# Patient Record
Sex: Female | Born: 1955 | Hispanic: No | Marital: Married | Smoking: Never smoker
Health system: Southern US, Community
[De-identification: ages and names within clinical notes are randomized; demographics above are authoritative.]

## PROBLEM LIST (undated history)

## (undated) DIAGNOSIS — M1990 Unspecified osteoarthritis, unspecified site: Secondary | ICD-10-CM

## (undated) DIAGNOSIS — I10 Essential (primary) hypertension: Secondary | ICD-10-CM

## (undated) DIAGNOSIS — C801 Malignant (primary) neoplasm, unspecified: Secondary | ICD-10-CM

## (undated) DIAGNOSIS — Z8679 Personal history of other diseases of the circulatory system: Secondary | ICD-10-CM

## (undated) DIAGNOSIS — J45909 Unspecified asthma, uncomplicated: Secondary | ICD-10-CM

## (undated) HISTORY — DX: Unspecified asthma, uncomplicated: J45.909

## (undated) HISTORY — DX: Unspecified osteoarthritis, unspecified site: M19.90

## (undated) HISTORY — DX: Essential (primary) hypertension: I10

## (undated) HISTORY — PX: OTHER SURGICAL HISTORY: SHX169

## (undated) HISTORY — DX: Malignant (primary) neoplasm, unspecified: C80.1

## (undated) HISTORY — PX: CHOLECYSTECTOMY: SHX55

---

## 2004-02-29 ENCOUNTER — Other Ambulatory Visit: Admission: RE | Admit: 2004-02-29 | Discharge: 2004-02-29 | Payer: Self-pay | Admitting: Obstetrics and Gynecology

## 2004-03-28 ENCOUNTER — Ambulatory Visit (HOSPITAL_COMMUNITY): Admission: RE | Admit: 2004-03-28 | Discharge: 2004-03-28 | Payer: Self-pay | Admitting: Obstetrics and Gynecology

## 2004-04-05 ENCOUNTER — Ambulatory Visit (HOSPITAL_COMMUNITY): Admission: RE | Admit: 2004-04-05 | Discharge: 2004-04-05 | Payer: Self-pay | Admitting: Obstetrics and Gynecology

## 2007-03-01 ENCOUNTER — Encounter: Admission: RE | Admit: 2007-03-01 | Discharge: 2007-03-01 | Payer: Self-pay | Admitting: Obstetrics and Gynecology

## 2007-03-03 ENCOUNTER — Emergency Department (HOSPITAL_COMMUNITY): Admission: EM | Admit: 2007-03-03 | Discharge: 2007-03-04 | Payer: Self-pay | Admitting: Emergency Medicine

## 2007-03-08 ENCOUNTER — Encounter (INDEPENDENT_AMBULATORY_CARE_PROVIDER_SITE_OTHER): Payer: Self-pay | Admitting: General Surgery

## 2007-03-08 ENCOUNTER — Ambulatory Visit (HOSPITAL_COMMUNITY): Admission: RE | Admit: 2007-03-08 | Discharge: 2007-03-08 | Payer: Self-pay | Admitting: General Surgery

## 2008-04-03 ENCOUNTER — Ambulatory Visit (HOSPITAL_COMMUNITY): Admission: RE | Admit: 2008-04-03 | Discharge: 2008-04-03 | Payer: Self-pay | Admitting: Obstetrics and Gynecology

## 2008-04-03 ENCOUNTER — Encounter (HOSPITAL_COMMUNITY): Payer: Self-pay | Admitting: Obstetrics and Gynecology

## 2008-09-08 IMAGING — MG MM DIAGNOSTIC BILATERAL
4 series · 4 of 4 positions shown · non-contrast
Comparison: 03/28/04.

DG DIAGNOSTIC BILATERAL
Bilateral CC and MLO view(s) were taken.

DIGITAL BILATERAL DIAGNOSTIC MAMMOGRAM WITH CAD:
CLINICAL DATA: 51-year-old female with diffuse left breast pain, cyclical.

[R CC]
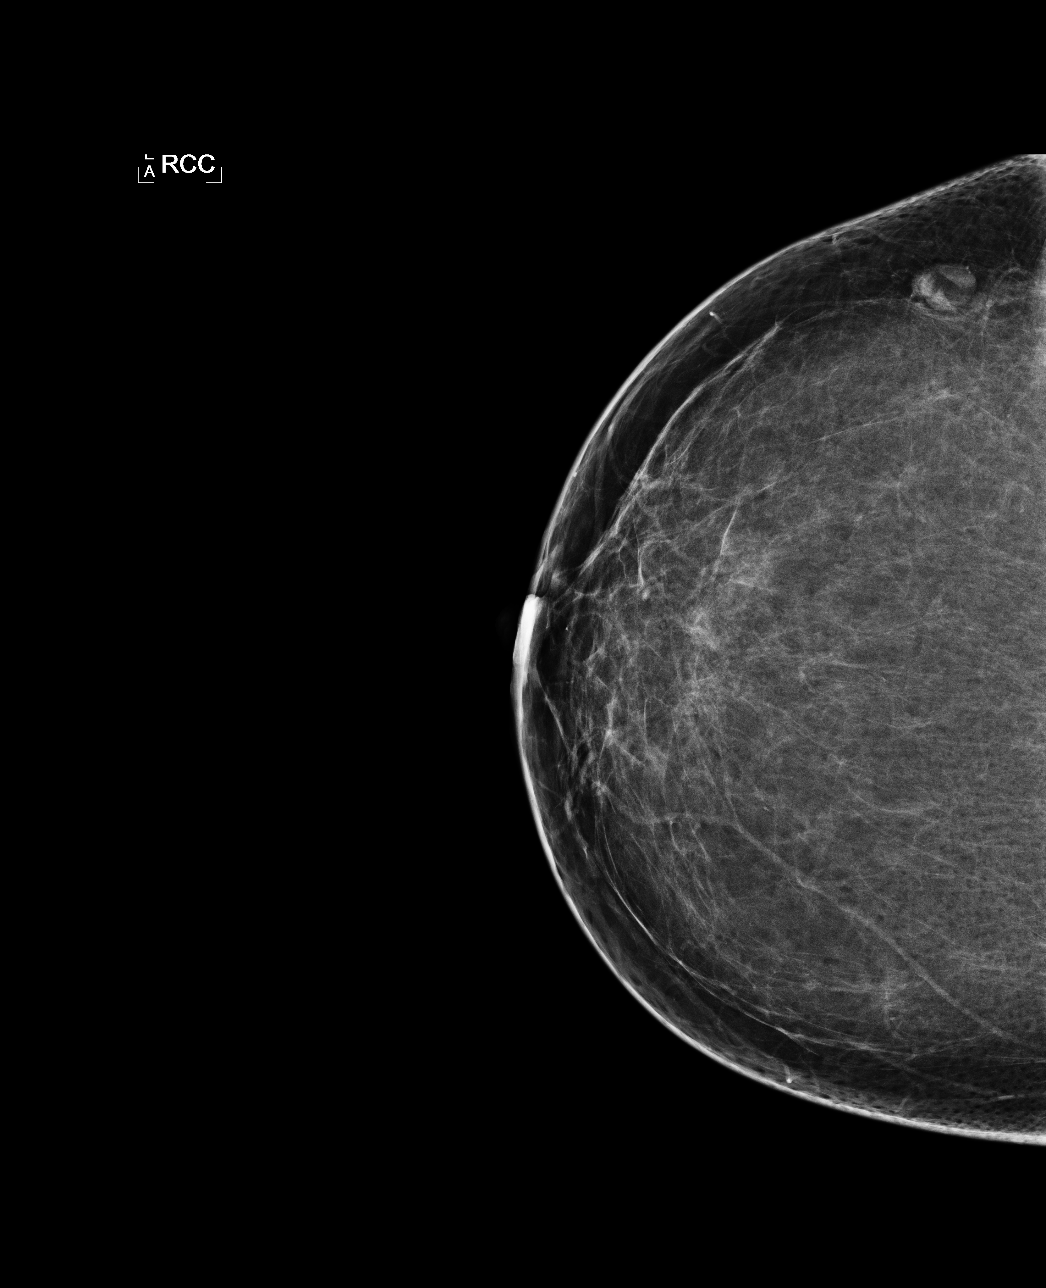

[L CC]
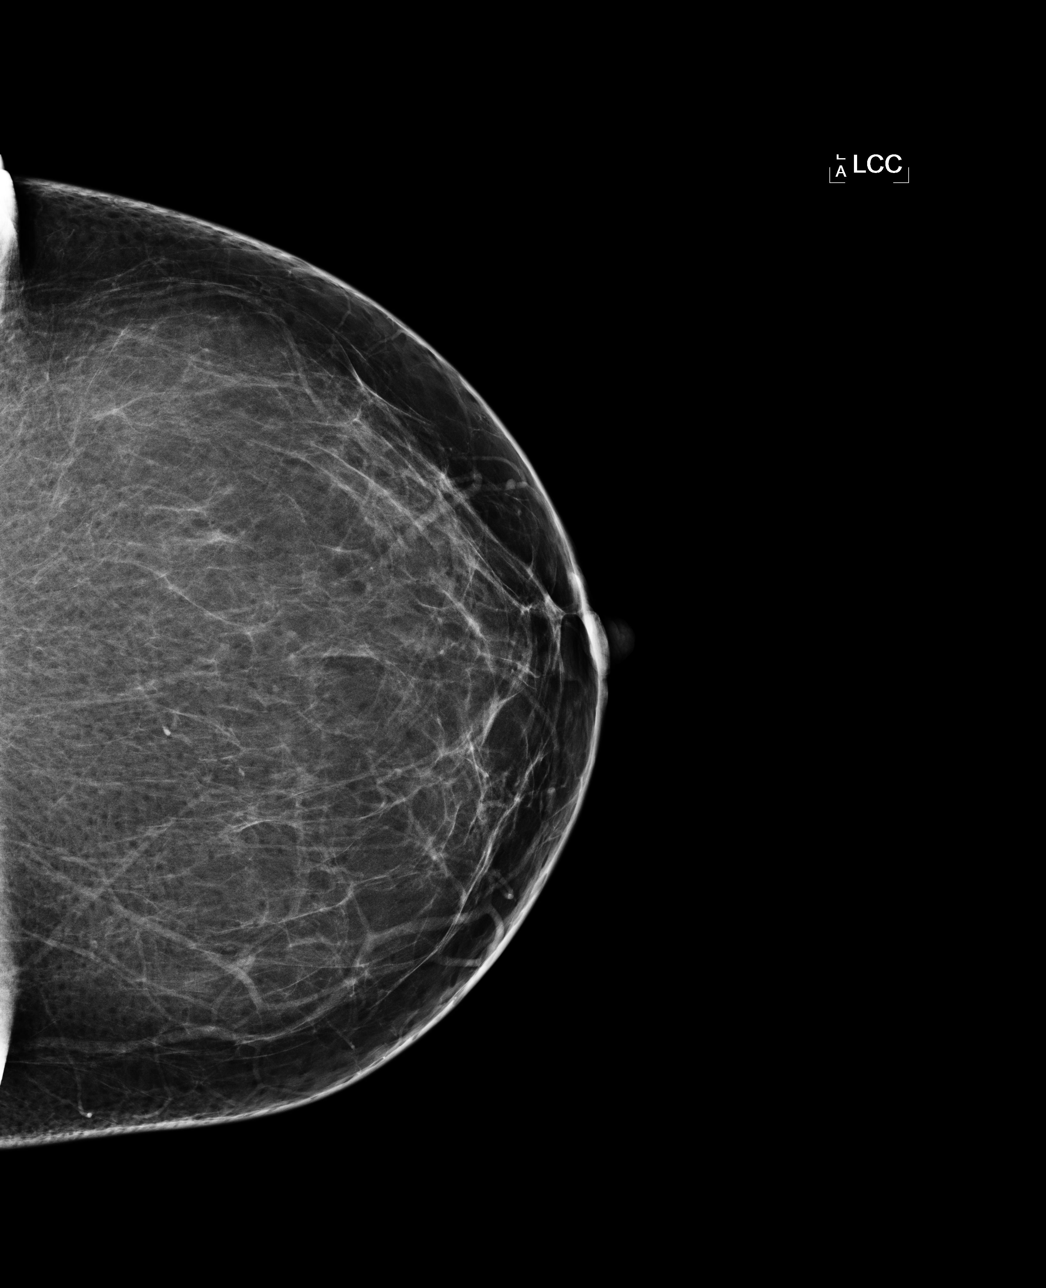

[L MLO]
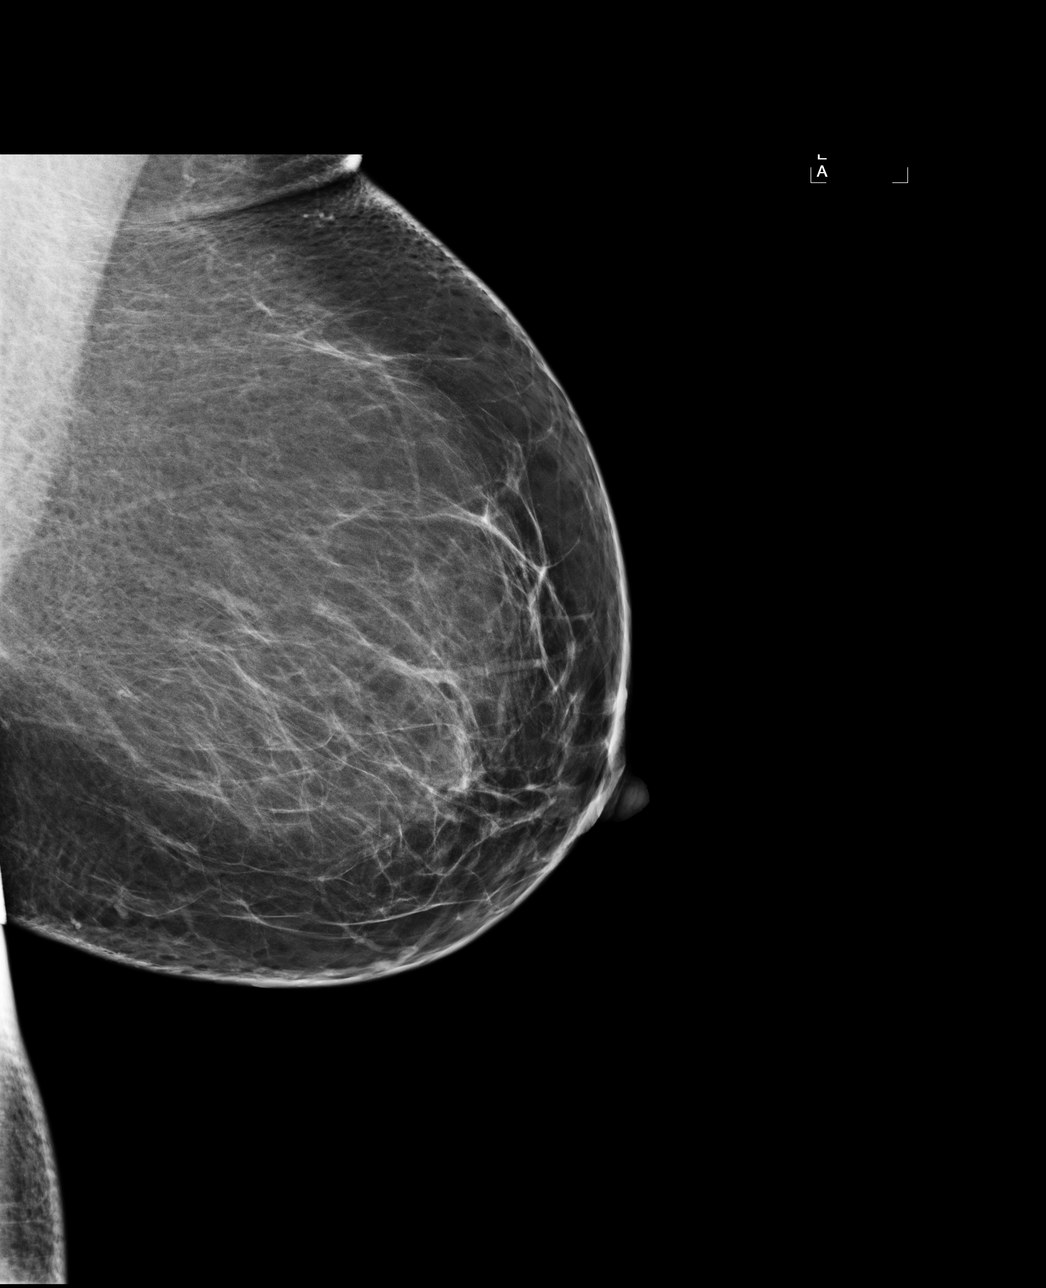

[R MLO]
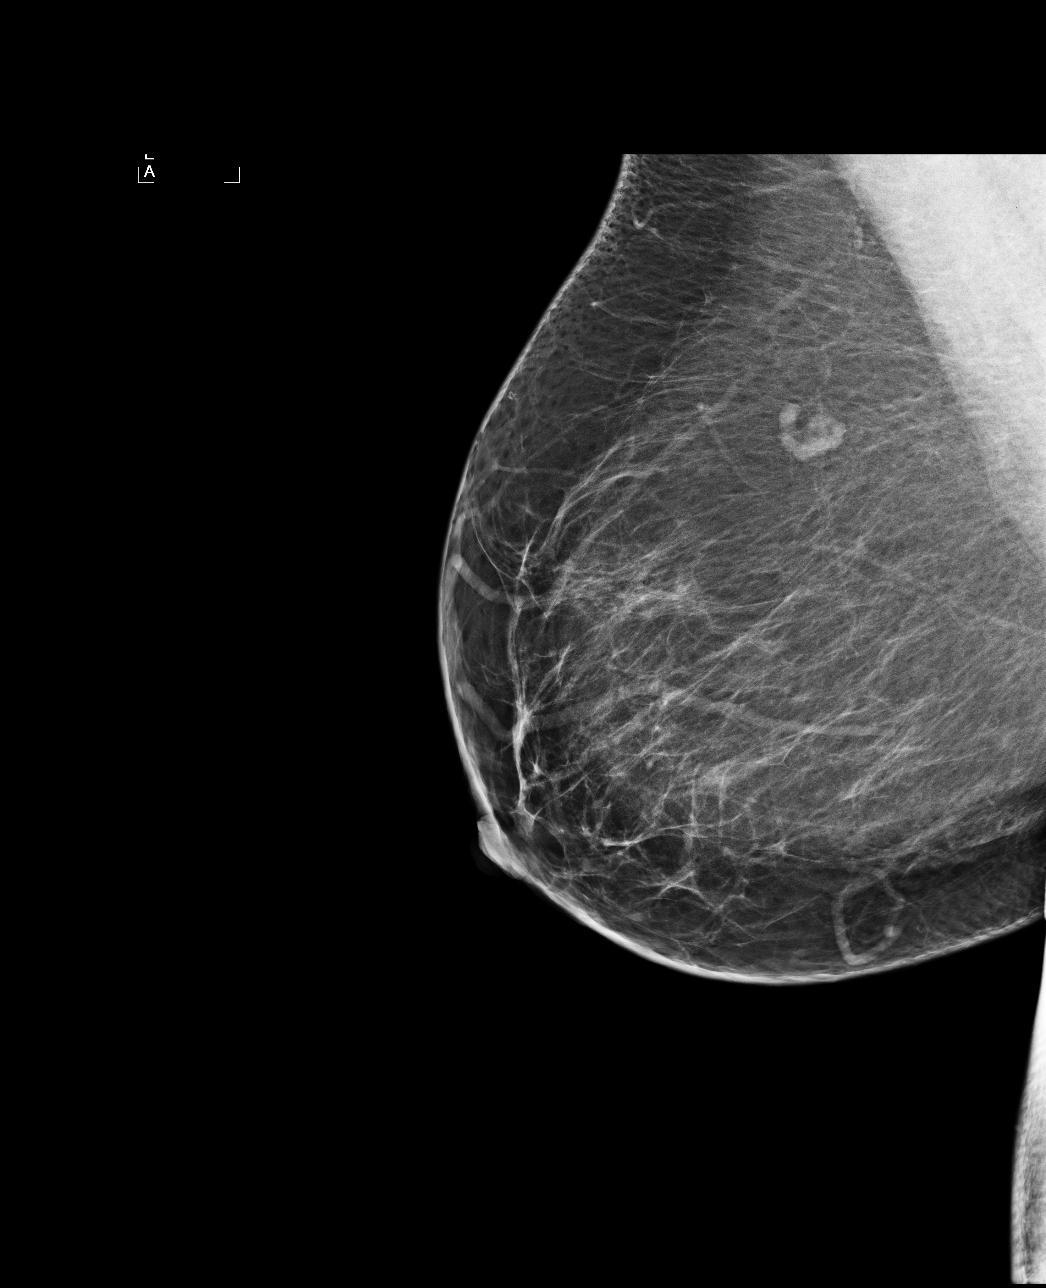

[4 of 4 positions shown; findings below may reference images not displayed]

Breast tissue is primarily fatty bilaterally.  There is no evidence of mass or calcifications to 
suggest malignancy.  There has been no interval change since the prior study.
IMPRESSION: No mammographic evidence of malignancy.

Recommend bilateral screening mammogram in one year.

ASSESSMENT: Negative - BI-RADS 1

Screening mammogram of both breasts in 1 year.
ANALYZED BY COMPUTER AIDED DETECTION. , THIS PROCEDURE WAS A DIGITAL MAMMOGRAM

## 2010-11-01 NOTE — Op Note (Signed)
Anna Stanton, Anna Stanton              ACCOUNT NO.:  [id number]   MEDICAL RECORD NO.:  [id number]          PATIENT TYPE:  AMB   LOCATION:  DAY                          FACILITY:  [facility]   PHYSICIAN:  Lennie Muckle, MD      DATE OF BIRTH:  [birth year]   DATE OF PROCEDURE:  03/08/2007  DATE OF DISCHARGE:                               OPERATIVE REPORT   PREOPERATIVE DIAGNOSIS:  Symptomatic cholelithiasis.   POSTOPERATIVE DIAGNOSIS:  Symptomatic cholelithiasis.   PROCEDURE:  Laparoscopic cholecystectomy with intraoperative  cholangiogram.   ATTENDING SURGEON:  Bertram Savin, M.D.   ASSISTANT:  Avel Peace, M.D.   General endotracheal anesthesia.   SPECIMEN:  Gallbladder.   BLOOD LOSS:  Less than 10 mL.   No immediate complications.  No drains were placed.   INDICATIONS FOR PROCEDURE:  Ms. Weidemann is a 55 year old female who I  have seen in the office for complaints of epigastric discomfort, nausea,  vomiting and had an ultrasound with gallstones.  Clinical examination  was consistent with symptomatic cholelithiasis.  She did have a mild  elevation in her liver enzymes preoperatively.  It was discussed to  perform cholangiogram to ensure no obstruction of the common duct.  Informed consent was obtained prior to this procedure.   DETAILS OF PROCEDURE:  Ms. Thurow was identified in the preoperative  holding area.  She was then taken to the operating suite after receiving  preoperative antibiotics.  She was then placed in the supine position  and received general endotracheal anesthesia.  Her abdomen was prepped  and draped in the usual sterile fashion.  A time out was performed to  identify the patient and procedure. Using a #11 blade, a supraumbilical  incision was placed and the fascia grasped with Kocher. A Veress needle  placed into the abdominal cavity to obtain pneumoperitoneum.  After  adequate insufflation  the camera was placed into the abdominal cavity  using the Optiview  trocar.  There was no evidence of injury upon entry  into the abdominal cavity with either the Veress needle or the trocar.  Three additional 5 mm ports were then placed, one at the epigastric  region, two along the right costochondral margin.  Using a blunt  grasper, the fundus of the gallbladder was retracted up toward the head  of the patient.  The infundibulum was grasped away from the liver bed  and the peritoneum surrounding the infundibulum area was carefully  dissected with the Maryland forceps.  The cystic duct was able to be  fully identified after dissection on the peritoneum.  A critical view  was placed of the cystic duct,cystic artery, and the liver bed prior to  placing a clip to perform the cholangiogram.  One clip was placed  proximally on the cystic duct and it was partially transected to perform  the cholangiogram.  Using a Cook catheter the cholangiogram was  performed successfully.  There was no evidence of obstruction with clear  patency of the ductal system to the liver and into the duodenum.  The  catheter was removed.  Two clips were then  placed distally on the cystic  duct and it was transected with laparoscopic scissors.  The cystic  artery was then fully dissected.  Two clips proximally and one distally.  It was then transected.  The remaining peritoneal attachments were taken  with the hook electrocautery.  The liver bed was dry after fully  removing the gallbladder.  The gallbladder was placed in an EndoCatch  bag and removed from the abdominal cavity.  The abdomen was irrigated  with approximately 1L of saline.  There was no evidence of bleeding at  the end of the case, clips were in place.  The fascia at the umbilicus  was closed with #0 Vicryl suture, skin was closed with #4-0 Monocryl.  Steri-Strips were placed for final dressing.  The patient was then  extubated and transported to the Post Anesthesia Care Unit in stable  condition.      Lennie Muckle, MD  Electronically Signed     ALA/MEDQ  D:  03/08/2007  T:  03/08/2007  Job:  [id number]

## 2010-11-01 NOTE — Op Note (Signed)
NAMEPRINCE, OLIVIER              ACCOUNT NO.:  [id number]   MEDICAL RECORD NO.:  [id number]          PATIENT TYPE:  AMB   LOCATION:  SDC                           FACILITY:  [facility]   PHYSICIAN:  Zelphia Cairo, MD    DATE OF BIRTH:  Jul 24, 1955   DATE OF PROCEDURE:  04/03/2008  DATE OF DISCHARGE:                               OPERATIVE REPORT   PREOPERATIVE DIAGNOSES:  1. Irregular menses.  2. Suspect endometrial polyp.   POSTOPERATIVE DIAGNOSES:  1. Irregular menses.  2. Suspect endometrial polyp.   PROCEDURES:  Hysteroscopy, D&C, NovaSure ablation, and cervical block.   SURGEON:  Zelphia Cairo, MD   ANESTHESIA:  General and local.   SPECIMEN:  Endometrial curettings to Pathology.   ESTIMATED BLOOD LOSS:  Minimal.   FLUID DEFICIT:  55 mL   COMPLICATIONS:  None.   CONDITION:  Stable to recovery room.   PROCEDURE:  Liat was taken to the operating room where a general  anesthesia was obtained.  She was placed in the dorsal lithotomy  position using Allen stirrups.  She was prepped and draped in sterile  fashion and an in-and-out catheter was used to drain her bladder for  approximately 30 mL of clear urine.  Bivalve speculum was placed in the  vagina and a single-tooth tenaculum on the anterior lip of the cervix.  Nesacaine 1% was used to provide a cervical block.  The uterus was  sounded to 11 cm.  The cervix sounded to 5 cm.  The cervix was serially  dilated using Pratt dilators.  Diagnostic hysteroscope was inserted into  the cavity and a survey was performed.  Fluffy endometrial tissue on  polypoid masses were noted throughout the cavity.  No other masses or  abnormalities were noted.  Bilateral ostia were visualized and appeared  normal.  Hysteroscope was then removed and a gentle curetting was  performed.  Specimen was placed on Telfa and passed off to be sent to  Pathology.  The NovaSure device was then inserted into the uterine  cavity and NovaSure ablation  was performed using standard manufacture  guidelines  for a total of 1 minute and 10 seconds.  Once the procedure was  complete, the NovaSure was allowed to cool, the NovaSure device was  removed from the cavity.  Single-tooth tenaculum was removed.  The  cervix was found to be hemostatic.  Speculum was removed, and the  patient was taken to the recovery room in stable condition.      Zelphia Cairo, MD  Electronically Signed     GA/MEDQ  D:  04/03/2008  T:  04/03/2008  Job:  [id number]

## 2011-03-21 LAB — CBC
HCT: 38
Hemoglobin: 11.9 — ABNORMAL LOW
MCHC: 31.4
MCV: 77.2 — ABNORMAL LOW
Platelets: 391
RBC: 4.92
RDW: 17.5 — ABNORMAL HIGH
WBC: 9.7

## 2011-03-21 LAB — TYPE AND SCREEN
ABO/RH(D): O POS
Antibody Screen: NEGATIVE

## 2011-03-21 LAB — ABO/RH: ABO/RH(D): O POS

## 2011-03-30 LAB — CBC
HCT: 34.3 — ABNORMAL LOW
Hemoglobin: 11.4 — ABNORMAL LOW
MCHC: 33.2
MCV: 81.7
Platelets: 388
RBC: 4.19
RDW: 15.5 — ABNORMAL HIGH
WBC: 9.8

## 2011-03-30 LAB — POCT CARDIAC MARKERS
CKMB, poc: <1 — ABNORMAL LOW
Myoglobin, poc: 27.4
Operator id: 1192
Troponin i, poc: <0.05

## 2011-03-30 LAB — DIFFERENTIAL
Basophils Absolute: 0.1
Basophils Relative: 1
Eosinophils Absolute: 0.1
Eosinophils Relative: 1
Lymphocytes Relative: 23
Lymphs Abs: 2.2
Monocytes Absolute: 0.9 — ABNORMAL HIGH
Monocytes Relative: 9
Neutro Abs: 6.5
Neutrophils Relative %: 67

## 2011-03-30 LAB — HEPATIC FUNCTION PANEL
ALT: 38 — ABNORMAL HIGH
AST: 19
Albumin: 3.3 — ABNORMAL LOW
Alkaline Phosphatase: 88
Bilirubin, Direct: 0.1
Indirect Bilirubin: 0.3
Total Bilirubin: 0.4
Total Protein: 6.7

## 2011-03-30 LAB — URINALYSIS, ROUTINE W REFLEX MICROSCOPIC
Bilirubin Urine: NEGATIVE
Glucose, UA: NEGATIVE
Hgb urine dipstick: NEGATIVE
Ketones, ur: NEGATIVE
Nitrite: NEGATIVE
Protein, ur: NEGATIVE
Specific Gravity, Urine: 1.016
Urobilinogen, UA: 1
pH: 6

## 2011-03-30 LAB — COMPREHENSIVE METABOLIC PANEL
ALT: 134 — ABNORMAL HIGH
AST: 231 — ABNORMAL HIGH
Albumin: 3.2 — ABNORMAL LOW
Alkaline Phosphatase: 93
BUN: 15
CO2: 24
Calcium: 8.7
Chloride: 106
Creatinine, Ser: 0.65
GFR calc Af Amer: >60
GFR calc non Af Amer: >60
Glucose, Bld: 116 — ABNORMAL HIGH
Potassium: 4.1
Sodium: 136
Total Bilirubin: 0.7
Total Protein: 6.7

## 2011-03-30 LAB — HEMOGLOBIN AND HEMATOCRIT, BLOOD
HCT: 37.2
Hemoglobin: 12.4

## 2011-03-30 LAB — URINE MICROSCOPIC-ADD ON

## 2011-03-30 LAB — D-DIMER, QUANTITATIVE: D-Dimer, Quant: 0.52 — ABNORMAL HIGH

## 2011-03-30 LAB — GAMMA GT: GGT: 67 — ABNORMAL HIGH

## 2011-03-30 LAB — PREGNANCY, URINE: Preg Test, Ur: NEGATIVE

## 2011-03-30 LAB — LIPASE, BLOOD: Lipase: 176 — ABNORMAL HIGH

## 2011-10-18 ENCOUNTER — Other Ambulatory Visit: Payer: Self-pay | Admitting: Allergy and Immunology

## 2011-10-18 ENCOUNTER — Ambulatory Visit
Admission: RE | Admit: 2011-10-18 | Discharge: 2011-10-18 | Disposition: A | Discharge status: Home / Self Care | Payer: BC Managed Care – PPO | Source: Ambulatory Visit | Attending: Allergy and Immunology | Admitting: Allergy and Immunology

## 2011-10-18 DIAGNOSIS — J449 Chronic obstructive pulmonary disease, unspecified: Secondary | ICD-10-CM

## 2013-04-07 ENCOUNTER — Ambulatory Visit (INDEPENDENT_AMBULATORY_CARE_PROVIDER_SITE_OTHER): Payer: BC Managed Care – PPO

## 2013-04-07 ENCOUNTER — Encounter: Payer: Self-pay | Admitting: Podiatry

## 2013-04-07 ENCOUNTER — Ambulatory Visit (INDEPENDENT_AMBULATORY_CARE_PROVIDER_SITE_OTHER): Payer: BC Managed Care – PPO | Admitting: Podiatry

## 2013-04-07 VITALS — BP 158/82 | HR 85 | Resp 16 | Ht 63.0 in

## 2013-04-07 DIAGNOSIS — M79609 Pain in unspecified limb: Secondary | ICD-10-CM

## 2013-04-07 DIAGNOSIS — M79672 Pain in left foot: Secondary | ICD-10-CM

## 2013-04-07 DIAGNOSIS — M7750 Other enthesopathy of unspecified foot: Secondary | ICD-10-CM

## 2013-04-07 DIAGNOSIS — M21612 Bunion of left foot: Secondary | ICD-10-CM

## 2013-04-07 DIAGNOSIS — M21619 Bunion of unspecified foot: Secondary | ICD-10-CM

## 2013-04-07 MED ORDER — TRIAMCINOLONE ACETONIDE 10 MG/ML IJ SUSP
5.0000 mg | Freq: Once | INTRAMUSCULAR | Status: AC
  Administered 2013-04-07: 5 mg via INTRA_ARTICULAR

## 2013-04-07 MED ORDER — METHYLPREDNISOLONE 4 MG PO KIT: PACK | ORAL | Status: DC

## 2013-04-07 NOTE — Progress Notes (Signed)
N-aches, burning L-forefoot and arch left D-several mos. O-gradual C-AM pain, worse A-walking, certain shoes T-stretching, ice, advil

## 2013-04-08 NOTE — Progress Notes (Signed)
Subjective:     Patient ID: Anna Stanton, female   DOB: [birth year], 57 y.o.   MRN: [id number]  Foot Pain   patient complains of pain on the top and outside of the left foot with inflammation around the fifth metatarsal head noted currently is not remember specific injury or other issues and also complains of moderate pain in the right foot   Review of Systems  All other systems reviewed and are negative.       Objective:   Physical Exam  Constitutional: She appears well-developed and well-nourished.  Cardiovascular: Intact distal pulses.   Neurological: She is alert.   patient's neurological is intact bilateral as are DTRs reflexes and mild equinus condition noted. Discomfort on the lateral side of the left foot around the fifth metatarsal head is most prominent with mild discomfort dorsally bilateral    Assessment:     Obese female who has foot structural issues with inflammation around the fifth metatarsal head left and mild dorsal tendinitis bilateral    Plan:     H&P and x-rays reviewed with the patient. Injected the fifth MPJ 3 mg dexamethasone Kenalog 5 mg Xylocaine and advised on wider shoes. Reappoint him next 2 week

## 2013-04-14 ENCOUNTER — Ambulatory Visit (INDEPENDENT_AMBULATORY_CARE_PROVIDER_SITE_OTHER): Payer: BC Managed Care – PPO | Admitting: Podiatry

## 2013-04-14 ENCOUNTER — Encounter: Payer: Self-pay | Admitting: Podiatry

## 2013-04-14 VITALS — BP 161/79 | HR 70 | Resp 12

## 2013-04-14 DIAGNOSIS — M21612 Bunion of left foot: Secondary | ICD-10-CM

## 2013-04-14 DIAGNOSIS — M21619 Bunion of unspecified foot: Secondary | ICD-10-CM

## 2013-04-14 DIAGNOSIS — M7750 Other enthesopathy of unspecified foot: Secondary | ICD-10-CM

## 2013-04-15 NOTE — Progress Notes (Signed)
Subjective:     Patient ID: Anna Stanton, female   DOB: Mar 17, 1956, 57 y.o.   MRN: [id number]  Foot Pain   patient states that the outside of my left foot feels a lot better and still having mild pain on the top but that is also improved states that her feet do get tired in general and they are very flat and she would like to know about arch support   Review of Systems  All other systems reviewed and are negative.       Objective:   Physical Exam  Nursing note and vitals reviewed. Constitutional: She appears well-developed and well-nourished.  Cardiovascular: Intact distal pulses.   Musculoskeletal: Normal range of motion.  Neurological: She is alert.   patient's outer left foot is improved from previous with diminishment of edema and diminishment of pain when palpating. It note that there continues to be a flatfoot deformity with moderate posterior tibial tendinitis bilateral and mild dorsal pain left still noted    Assessment:     Improved pain around the acute fifth MPJ left with continued chronic pain secondary to foot structure and moderate obesity    Plan:     Educated patient and recommended orthotics to provide support and reduced tendon-like symptoms scanned for custom orthotics today and will return when orthotics are returned

## 2013-05-19 ENCOUNTER — Ambulatory Visit: Payer: BC Managed Care – PPO | Admitting: Podiatry

## 2013-05-26 ENCOUNTER — Encounter: Payer: Self-pay | Admitting: Podiatry

## 2013-05-26 ENCOUNTER — Ambulatory Visit (INDEPENDENT_AMBULATORY_CARE_PROVIDER_SITE_OTHER): Payer: BC Managed Care – PPO | Admitting: Podiatry

## 2013-05-26 VITALS — BP 122/67 | HR 75 | Resp 16

## 2013-05-26 DIAGNOSIS — M7750 Other enthesopathy of unspecified foot: Secondary | ICD-10-CM

## 2013-05-26 NOTE — Patient Instructions (Signed)

## 2013-05-28 NOTE — Progress Notes (Signed)
Subjective:     Patient ID: Anna Stanton, female   DOB: 1955/10/29, 57 y.o.   MRN: [id number]  HPI patient is found to have feet better still tender but improving utilizing previous medication and physical therapy   Review of Systems     Objective:   Physical Exam Neurovascular status intact with no health history changes noted. Patient is found to have diminished discomfort in the ankle joint subtalar joint with increased range of motion    Assessment:     Improving foot structure secondary to previous treatment and physical therapy    Plan:     Dispensed orthotics with instructions and gave continued instructions for physical therapy anti-inflammatories and reduced activity reappoint her recheck in 6 weeks

## 2013-07-14 ENCOUNTER — Other Ambulatory Visit: Payer: Self-pay | Admitting: Allergy and Immunology

## 2013-07-14 ENCOUNTER — Ambulatory Visit
Admission: RE | Admit: 2013-07-14 | Discharge: 2013-07-14 | Disposition: A | Discharge status: Home / Self Care | Payer: BC Managed Care – PPO | Source: Ambulatory Visit | Attending: Allergy and Immunology | Admitting: Allergy and Immunology

## 2013-07-14 DIAGNOSIS — R05 Cough: Secondary | ICD-10-CM

## 2013-07-14 DIAGNOSIS — R059 Cough, unspecified: Secondary | ICD-10-CM

## 2013-07-28 ENCOUNTER — Ambulatory Visit (INDEPENDENT_AMBULATORY_CARE_PROVIDER_SITE_OTHER): Payer: BC Managed Care – PPO | Admitting: Podiatry

## 2013-07-28 ENCOUNTER — Encounter: Payer: Self-pay | Admitting: Podiatry

## 2013-07-28 VITALS — BP 144/82 | HR 86 | Resp 12

## 2013-07-28 DIAGNOSIS — M7750 Other enthesopathy of unspecified foot: Secondary | ICD-10-CM

## 2013-07-29 NOTE — Progress Notes (Signed)
Subjective:     Patient ID: Anna Stanton, female   DOB: 02/19/1956, 58 y.o.   MRN: [id number]  HPI patient presents that my feet feel good but I still have trouble wearing my orthotics and certain shoes would like to get a second pair  Review of Systems     Objective:   Physical Exam Neurovascular status intact with feet that are feeling much better and orthotics that are well contoured    Assessment:     Improve tendinitis condition    Plan:     Recommended new pair orthotics and we will have a second pair made and advised on continued physical therapy

## 2013-08-15 ENCOUNTER — Encounter: Payer: Self-pay | Admitting: Podiatry

## 2014-05-05 ENCOUNTER — Ambulatory Visit (INDEPENDENT_AMBULATORY_CARE_PROVIDER_SITE_OTHER): Payer: BC Managed Care – PPO

## 2014-05-05 ENCOUNTER — Ambulatory Visit (INDEPENDENT_AMBULATORY_CARE_PROVIDER_SITE_OTHER): Payer: BC Managed Care – PPO | Admitting: Podiatry

## 2014-05-05 ENCOUNTER — Encounter: Payer: Self-pay | Admitting: Podiatry

## 2014-05-05 VITALS — BP 138/82 | HR 70 | Resp 16

## 2014-05-05 DIAGNOSIS — M779 Enthesopathy, unspecified: Secondary | ICD-10-CM

## 2014-05-05 DIAGNOSIS — S92302A Fracture of unspecified metatarsal bone(s), left foot, initial encounter for closed fracture: Secondary | ICD-10-CM

## 2014-05-05 NOTE — Progress Notes (Signed)
Subjective:     Patient ID: Anna Stanton, female   DOB: [birth year], 58 y.o.   MRN: [id number]  HPIpatient states I twisted my left foot and it's been hurting me very badly since Sunday and I fell down several inches on a step. Patient states that it's been swollen and sore and it feels like it could be broken   Review of Systems     Objective:   Physical Exam Neurovascular status intact with quite a bit of discomfort in the left dorsal lateral foot with inflammation and fluid especially around the base of the third fourth metatarsal cuneiforms and around the fifth metatarsal    Assessment:     Possibility for fracture of the bases of the fourth and third metatarsal left    Plan:     Reviewed x-rays with patient and due to possibility for fracture I did place an air fracture walker to immobilize. I discussed that ultimately this could create arthritis and  not heal and may require surgery

## 2014-05-26 ENCOUNTER — Encounter: Payer: Self-pay | Admitting: Podiatry

## 2014-05-26 ENCOUNTER — Ambulatory Visit (INDEPENDENT_AMBULATORY_CARE_PROVIDER_SITE_OTHER): Payer: BC Managed Care – PPO | Admitting: Podiatry

## 2014-05-26 ENCOUNTER — Ambulatory Visit (INDEPENDENT_AMBULATORY_CARE_PROVIDER_SITE_OTHER): Payer: BC Managed Care – PPO

## 2014-05-26 VITALS — BP 146/80 | HR 75 | Resp 16

## 2014-05-26 DIAGNOSIS — S92302D Fracture of unspecified metatarsal bone(s), left foot, subsequent encounter for fracture with routine healing: Secondary | ICD-10-CM

## 2014-05-26 DIAGNOSIS — M79672 Pain in left foot: Secondary | ICD-10-CM

## 2014-05-26 NOTE — Progress Notes (Signed)
Subjective:     Patient ID: Anna Stanton, female   DOB: [birth year], 58 y.o.   MRN: [id number]  HPI patient states that her foot feels quite a bit better in the boot but she has to be on it all day at school and it does swell and it is still painful when I pressed the metatarsals.   Review of Systems     Objective:   Physical Exam Neurovascular status intact with edema left third and fourth metatarsal still noted and moderate discomfort when the areas pressed but not as significant as what it was several weeks ago    Assessment:     Improving from possibility of fracture base the metatarsals left foot and possible fracture of other area difficult to evaluate    Plan:     Reviewed with patient the possibility for fracture and the possibility for CT scan in the future and offered her to her today but she refuses this and would rather see how it does. Reapplied her boot but first applied Unna boot Ace wrap to try to reduce the swelling and advised on gradual return to surgical shoe over the next 4 weeks and increased activity as tolerated. 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 Patient will be seen back in 4 weeks earlier if necessary

## 2014-06-23 ENCOUNTER — Encounter: Payer: Self-pay | Admitting: Podiatry

## 2014-06-23 ENCOUNTER — Ambulatory Visit (INDEPENDENT_AMBULATORY_CARE_PROVIDER_SITE_OTHER): Payer: BC Managed Care – PPO | Admitting: Podiatry

## 2014-06-23 ENCOUNTER — Ambulatory Visit (INDEPENDENT_AMBULATORY_CARE_PROVIDER_SITE_OTHER): Payer: BC Managed Care – PPO

## 2014-06-23 VITALS — BP 138/72 | HR 70 | Resp 16

## 2014-06-23 DIAGNOSIS — R609 Edema, unspecified: Secondary | ICD-10-CM

## 2014-06-23 DIAGNOSIS — S92302D Fracture of unspecified metatarsal bone(s), left foot, subsequent encounter for fracture with routine healing: Secondary | ICD-10-CM

## 2014-06-23 NOTE — Progress Notes (Signed)
Subjective:     Patient ID: Anna Stanton, female   DOB: [birth year], 59 y.o.   MRN: [id number]  HPI patient states that she still getting a lot of pain in her left foot and ankle. Stated that the [employer] that we applied was the best that she is felt and it did reduce the swelling and besides that she has had continued discomfort in the forefoot left and the left medial ankle. Started back to work today which is been more difficult. Has been wearing her boot as much as possible   Review of Systems     Objective:   Physical Exam Neurovascular status unchanged with continued discomfort in the midfoot left and into the lateral side of the midfoot with discomfort in the medial ankle with reasonable range of motion of the ankle joint inversion eversion. The pain still is quite prominent it seems slightly better than previous visit    Assessment:     Possibility for fractures of the midfoot left and the Lisfranc joint left and possibility for ankle injury left with very slight improvement but patient being obese certainly is a complicating factor    Plan:     Reviewed this with patient and we discussed consideration for CT scan. We are going to hold off and I applied an Haematologist Ace wrap today which we will reapply next week and we will give her another 4-6 weeks to see if continued improvement occurs and if it does not we will need to consider a CT scan for this patient

## 2014-06-30 ENCOUNTER — Ambulatory Visit: Payer: Self-pay

## 2014-06-30 ENCOUNTER — Ambulatory Visit (INDEPENDENT_AMBULATORY_CARE_PROVIDER_SITE_OTHER): Payer: BC Managed Care – PPO | Admitting: Podiatry

## 2014-06-30 ENCOUNTER — Encounter: Payer: Self-pay | Admitting: Podiatry

## 2014-06-30 VITALS — BP 138/72 | HR 70 | Resp 16

## 2014-06-30 DIAGNOSIS — S92302D Fracture of unspecified metatarsal bone(s), left foot, subsequent encounter for fracture with routine healing: Secondary | ICD-10-CM

## 2014-06-30 MED ORDER — HYDROCODONE-IBUPROFEN 5-200 MG PO TABS: 1.0000 | ORAL_TABLET | Freq: Three times a day (TID) | ORAL | Status: DC | PRN

## 2014-07-02 NOTE — Progress Notes (Signed)
Subjective:     Patient ID: Anna Stanton, female   DOB: 05/17/1956, 59 y.o.   MRN: [id number]  HPI patient states my left foot is feeling quite a bit better with the Unna boot and wearing the Cam Walker. States she still has some pain if she does a lot of walking but is optimistic that there is some improvement   Review of Systems     Objective:   Physical Exam Neurovascular status intact with diminished edema in the left midfoot and ankle with pain still noted upon deep palpation but improved from previous visit    Assessment:     Possibility for Lisfranc's injury or subtle fracture of the midfoot and ankle sprain left that improved with compression treatment    Plan:     Reapplied the Unna boot and Ace wrap and we will continue immobilization when it removed. I explained to her the pain may recur relatively rapidly or we may be able to reduce this and with continued immobilization the pain will recede. Still may require MRI

## 2014-08-11 ENCOUNTER — Ambulatory Visit: Payer: BC Managed Care – PPO | Admitting: Podiatry

## 2015-05-04 ENCOUNTER — Ambulatory Visit: Payer: Self-pay | Admitting: Orthopedic Surgery

## 2015-05-04 NOTE — Progress Notes (Signed)
Preoperative surgical orders have been place into the Epic hospital system for Anna Stanton on 05/04/2015, 12:32 PM  by Mickel Crow for surgery on 05-19-2015.  Preop Total Hip - Anterior Approach orders including IV Tylenol, and IV Decadron as long as there are no contraindications to the above medications. Arlee Muslim, PA-C

## 2015-05-11 ENCOUNTER — Encounter (HOSPITAL_COMMUNITY)
Admission: RE | Admit: 2015-05-11 | Discharge: 2015-05-11 | Disposition: A | Discharge status: Home / Self Care | Payer: BC Managed Care – PPO | Source: Ambulatory Visit | Attending: Orthopedic Surgery | Admitting: Orthopedic Surgery

## 2015-05-11 ENCOUNTER — Encounter (HOSPITAL_COMMUNITY): Payer: Self-pay

## 2015-05-11 DIAGNOSIS — Z01812 Encounter for preprocedural laboratory examination: Secondary | ICD-10-CM | POA: Insufficient documentation

## 2015-05-11 HISTORY — DX: Personal history of other diseases of the circulatory system: Z86.79

## 2015-05-11 LAB — CBC
HEMATOCRIT: 41.6 % (ref 36.0–46.0)
HEMOGLOBIN: 13.5 g/dL (ref 12.0–15.0)
MCH: 28.7 pg (ref 26.0–34.0)
MCHC: 32.5 g/dL (ref 30.0–36.0)
MCV: 88.3 fL (ref 78.0–100.0)
Platelets: 328 10*3/uL (ref 150–400)
RBC: 4.71 MIL/uL (ref 3.87–5.11)
RDW: 13.9 % (ref 11.5–15.5)
WBC: 9.1 10*3/uL (ref 4.0–10.5)

## 2015-05-11 LAB — URINALYSIS, ROUTINE W REFLEX MICROSCOPIC
BILIRUBIN URINE: NEGATIVE
GLUCOSE, UA: NEGATIVE mg/dL
KETONES UR: NEGATIVE mg/dL
Nitrite: NEGATIVE
Protein, ur: NEGATIVE mg/dL
Specific Gravity, Urine: 1.018 (ref 1.005–1.030)
pH: 5.5 (ref 5.0–8.0)

## 2015-05-11 LAB — COMPREHENSIVE METABOLIC PANEL WITH GFR
ALT: 21 U/L (ref 14–54)
AST: 24 U/L (ref 15–41)
Albumin: 3.6 g/dL (ref 3.5–5.0)
Alkaline Phosphatase: 82 U/L (ref 38–126)
Anion gap: 7 (ref 5–15)
BUN: 18 mg/dL (ref 6–20)
CO2: 29 mmol/L (ref 22–32)
Calcium: 9.4 mg/dL (ref 8.9–10.3)
Chloride: 104 mmol/L (ref 101–111)
Creatinine, Ser: 0.64 mg/dL (ref 0.44–1.00)
GFR calc Af Amer: >60 mL/min
GFR calc non Af Amer: >60 mL/min
Glucose, Bld: 89 mg/dL (ref 65–99)
Potassium: 4.9 mmol/L (ref 3.5–5.1)
Sodium: 140 mmol/L (ref 135–145)
Total Bilirubin: 0.5 mg/dL (ref 0.3–1.2)
Total Protein: 7.2 g/dL (ref 6.5–8.1)

## 2015-05-11 LAB — HEPATIC FUNCTION PANEL
ALBUMIN: 3.6 g/dL (ref 3.5–5.0)
ALK PHOS: 84 U/L (ref 38–126)
ALT: 22 U/L (ref 14–54)
AST: 23 U/L (ref 15–41)
BILIRUBIN DIRECT: 0.1 mg/dL (ref 0.1–0.5)
BILIRUBIN TOTAL: 0.6 mg/dL (ref 0.3–1.2)
Indirect Bilirubin: 0.5 mg/dL (ref 0.3–0.9)
Total Protein: 7.2 g/dL (ref 6.5–8.1)

## 2015-05-11 LAB — SURGICAL PCR SCREEN
MRSA, PCR: POSITIVE — AB
Staphylococcus aureus: POSITIVE — AB

## 2015-05-11 LAB — APTT: aPTT: 26 s (ref 24–37)

## 2015-05-11 LAB — PROTIME-INR
INR: 0.94 (ref 0.00–1.49)
PROTHROMBIN TIME: 12.8 s (ref 11.6–15.2)

## 2015-05-11 LAB — URINE MICROSCOPIC-ADD ON

## 2015-05-11 LAB — ABO/RH: ABO/RH(D): O POS

## 2015-05-11 NOTE — Progress Notes (Signed)
RX MUPURICIN CALLED TO [facility] - PT NOTIFIED

## 2015-05-11 NOTE — Progress Notes (Signed)
   05/11/15 1042  OBSTRUCTIVE SLEEP APNEA  Have you ever been diagnosed with sleep apnea through a sleep study? No  Do you snore loudly (loud enough to be heard through closed doors)?  1  Do you often feel tired, fatigued, or sleepy during the daytime (such as falling asleep during driving or talking to someone)? 1  Has anyone observed you stop breathing during your sleep? 0  Do you have, or are you being treated for high blood pressure? 1  BMI more than 35 kg/m2? 1  Age > 50 (1-yes) 1  Neck circumference greater than:Female 16 inches or larger, Female 17inches or larger? 1  Female Gender (Yes=1) 0  Obstructive Sleep Apnea Score 6  Score 5 or greater  Results sent to PCP

## 2015-05-11 NOTE — Patient Instructions (Addendum)
YOUR PROCEDURE IS SCHEDULED ON :  05/19/15  REPORT TO [place] MAIN ENTRANCE FOLLOW SIGNS TO EAST ELEVATOR - GO TO 3rd FLOOR CHECK IN AT 3 EAST NURSES STATION (SHORT STAY) AT: 11:30 AM  CALL THIS NUMBER IF YOU HAVE PROBLEMS THE MORNING OF SURGERY [phone]  REMEMBER:ONLY 1 PER PERSON MAY GO TO SHORT STAY WITH YOU TO GET READY THE MORNING OF YOUR SURGERY  DO NOT EAT FOOD OR DRINK LIQUIDS AFTER MIDNIGHT  MAY HAVE WATER UNTIL 7:30 AM  TAKE THESE MEDICINES THE MORNING OF SURGERY: SYMBICORT / MAY USE QNASL NASAL SPRAY   YOU MAY NOT HAVE ANY METAL ON YOUR BODY INCLUDING HAIR PINS AND PIERCING'S. DO NOT WEAR JEWELRY, MAKEUP, LOTIONS, POWDERS OR PERFUMES. DO NOT WEAR NAIL POLISH. DO NOT SHAVE 48 HRS PRIOR TO SURGERY. MEN MAY SHAVE FACE AND NECK.  DO NOT [place]. [employer] IS NOT RESPONSIBLE FOR VALUABLES.  CONTACTS, DENTURES OR PARTIALS MAY NOT BE WORN TO SURGERY. LEAVE SUITCASE IN CAR. CAN BE BROUGHT TO ROOM AFTER SURGERY.  PATIENTS DISCHARGED THE DAY OF SURGERY WILL NOT BE ALLOWED TO DRIVE HOME.  PLEASE READ OVER THE FOLLOWING INSTRUCTION SHEETS _________________________________________________________________________________                                          [facility] - PREPARING FOR SURGERY  Before surgery, you can play an important role.  Because skin is not sterile, your skin needs to be as free of germs as possible.  You can reduce the number of germs on your skin by washing with CHG (chlorahexidine gluconate) soap before surgery.  CHG is an antiseptic cleaner which kills germs and bonds with the skin to continue killing germs even after washing. Please DO NOT use if you have an allergy to CHG or antibacterial soaps.  If your skin becomes reddened/irritated stop using the CHG and inform your nurse when you arrive at Short Stay. Do not shave (including legs and underarms) for at least 48 hours prior to the first CHG shower.  You  may shave your face. Please follow these instructions carefully:   1.  Shower with CHG Soap the night before surgery and the  morning of Surgery.   2.  If you choose to wash your hair, wash your hair first as usual with your  normal  Shampoo.   3.  After you shampoo, rinse your hair and body thoroughly to remove the  shampoo.                                         4.  Use CHG as you would any other liquid soap.  You can apply chg directly  to the skin and wash . Gently wash with scrungie or clean wascloth    5.  Apply the CHG Soap to your body ONLY FROM THE NECK DOWN.   Do not use on open                           Wound or open sores. Avoid contact with eyes, ears mouth and genitals (private parts).  Genitals (private parts) with your normal soap.              6.  Wash thoroughly, paying special attention to the area where your surgery  will be performed.   7.  Thoroughly rinse your body with warm water from the neck down.   8.  DO NOT shower/wash with your normal soap after using and rinsing off  the CHG Soap .                9.  Pat yourself dry with a clean towel.             10.  Wear clean night clothes to bed after shower             11.  Place clean sheets on your bed the night of your first shower and do not  sleep with pets.  Day of Surgery : Do not apply any lotions/deodorants the morning of surgery.  Please wear clean clothes to the hospital/surgery center.  FAILURE TO FOLLOW THESE INSTRUCTIONS MAY RESULT IN THE CANCELLATION OF YOUR SURGERY    PATIENT SIGNATURE_________________________________  ______________________________________________________________________     Anna Stanton  An incentive spirometer is a tool that can help keep your lungs clear and active. This tool measures how well you are filling your lungs with each breath. Taking long deep breaths may help reverse or decrease the chance of developing breathing  (pulmonary) problems (especially infection) following:  A long period of time when you are unable to move or be active. BEFORE THE PROCEDURE   If the spirometer includes an indicator to show your best effort, your nurse or respiratory therapist will set it to a desired goal.  If possible, sit up straight or lean slightly forward. Try not to slouch.  Hold the incentive spirometer in an upright position. INSTRUCTIONS FOR USE   Sit on the edge of your bed if possible, or sit up as far as you can in bed or on a chair.  Hold the incentive spirometer in an upright position.  Breathe out normally.  Place the mouthpiece in your mouth and seal your lips tightly around it.  Breathe in slowly and as deeply as possible, raising the piston or the ball toward the top of the column.  Hold your breath for 3-5 seconds or for as long as possible. Allow the piston or ball to fall to the bottom of the column.  Remove the mouthpiece from your mouth and breathe out normally.  Rest for a few seconds and repeat Steps 1 through 7 at least 10 times every 1-2 hours when you are awake. Take your time and take a few normal breaths between deep breaths.  The spirometer may include an indicator to show your best effort. Use the indicator as a goal to work toward during each repetition.  After each set of 10 deep breaths, practice coughing to be sure your lungs are clear. If you have an incision (the cut made at the time of surgery), support your incision when coughing by placing a pillow or rolled up towels firmly against it. Once you are able to get out of bed, walk around indoors and cough well. You may stop using the incentive spirometer when instructed by your caregiver.  RISKS AND COMPLICATIONS  Take your time so you do not get dizzy or light-headed.  If you are in pain, you may need to take or ask for pain medication before doing incentive spirometry. It is  harder to take a deep breath if you are having  pain. AFTER USE  Rest and breathe slowly and easily.  It can be helpful to keep track of a log of your progress. Your caregiver can provide you with a simple table to help with this. If you are using the spirometer at home, follow these instructions: [place] IF:   You are having difficultly using the spirometer.  You have trouble using the spirometer as often as instructed.  Your pain medication is not giving enough relief while using the spirometer.  You develop fever of 100.5 F (38.1 C) or higher. SEEK IMMEDIATE MEDICAL CARE IF:   You cough up bloody sputum that had not been present before.  You develop fever of 102 F (38.9 C) or greater.  You develop worsening pain at or near the incision site. MAKE SURE YOU:   Understand these instructions.  Will watch your condition.  Will get help right away if you are not doing well or get worse. Document Released: 10/16/2006 Document Revised: 08/28/2011 Document Reviewed: 12/17/2006 Exit[employer] Patient Information 2014 Exit[employer], [place].   ________________________________________________________________________  WHAT IS A BLOOD TRANSFUSION? Blood Transfusion Information  A transfusion is the replacement of blood or some of its parts. Blood is made up of multiple cells which provide different functions.  Red blood cells carry oxygen and are used for blood loss replacement.  White blood cells fight against infection.  Platelets control bleeding.  Plasma helps clot blood.  Other blood products are available for specialized needs, such as hemophilia or other clotting disorders. BEFORE THE TRANSFUSION  Who gives blood for transfusions?   Healthy volunteers who are fully evaluated to make sure their blood is safe. This is blood bank blood. Transfusion therapy is the safest it has ever been in the practice of medicine. Before blood is taken from a donor, a complete history is taken to make sure that person has no history  of diseases nor engages in risky social behavior (examples are intravenous drug use or sexual activity with multiple partners). The donor's travel history is screened to minimize risk of transmitting infections, such as malaria. The donated blood is tested for signs of infectious diseases, such as HIV and hepatitis. The blood is then tested to be sure it is compatible with you in order to minimize the chance of a transfusion reaction. If you or a relative donates blood, this is often done in anticipation of surgery and is not appropriate for emergency situations. It takes many days to process the donated blood. RISKS AND COMPLICATIONS Although transfusion therapy is very safe and saves many lives, the main dangers of transfusion include:   Getting an infectious disease.  Developing a transfusion reaction. This is an allergic reaction to something in the blood you were given. Every precaution is taken to prevent this. The decision to have a blood transfusion has been considered carefully by your caregiver before blood is given. Blood is not given unless the benefits outweigh the risks. AFTER THE TRANSFUSION  Right after receiving a blood transfusion, you will usually feel much better and more energetic. This is especially true if your red blood cells have gotten low (anemic). The transfusion raises the level of the red blood cells which carry oxygen, and this usually causes an energy increase.  The nurse administering the transfusion will monitor you carefully for complications. HOME CARE INSTRUCTIONS  No special instructions are needed after a transfusion. You may find your energy is better. Speak  with your caregiver about any limitations on activity for underlying diseases you may have. SEEK MEDICAL CARE IF:   Your condition is not improving after your transfusion.  You develop redness or irritation at the intravenous (IV) site. SEEK IMMEDIATE MEDICAL CARE IF:  Any of the following symptoms  occur over the next 12 hours:  Shaking chills.  You have a temperature by mouth above 102 F (38.9 C), not controlled by medicine.  Chest, back, or muscle pain.  People around you feel you are not acting correctly or are confused.  Shortness of breath or difficulty breathing.  Dizziness and fainting.  You get a rash or develop hives.  You have a decrease in urine output.  Your urine turns a dark color or changes to pink, red, or brown. Any of the following symptoms occur over the next 10 days:  You have a temperature by mouth above 102 F (38.9 C), not controlled by medicine.  Shortness of breath.  Weakness after normal activity.  The white part of the eye turns yellow (jaundice).  You have a decrease in the amount of urine or are urinating less often.  Your urine turns a dark color or changes to pink, red, or brown. Document Released: 06/02/2000 Document Revised: 08/28/2011 Document Reviewed: 01/20/2008 [facility] Patient Information 2014 [place], [place].  _______________________________________________________________________

## 2015-05-18 NOTE — H&P (Signed)
TOTAL HIP ADMISSION H&P  Patient is admitted for right total hip arthroplasty.  Subjective:  Chief Complaint: right hip pain  HPI: Anna Stanton, 59 y.o. female, has a history of pain and functional disability in the right hip(s) due to arthritis and patient has failed non-surgical conservative treatments for greater than 12 weeks to include NSAID's and/or analgesics, corticosteriod injections, flexibility and strengthening excercises and activity modification.  Onset of symptoms was abrupt starting 1 year ago with rapidlly worsening course since that time.The patient noted no past surgery on the right hip(s).  Patient currently rates pain in the right hip at 8 out of 10 with activity. Patient has night pain, worsening of pain with activity and weight bearing, pain that interfers with activities of daily living, pain with passive range of motion and crepitus. Patient has evidence of periarticular osteophytes and joint space narrowing by imaging studies. This condition presents safety issues increasing the risk of falls.  There is no current active infection.  Past Medical History  Diagnosis Date  . OA (osteoarthritis)   . Hypertension   . Asthma   . H/O: rheumatic fever     AS CHILD  . Cancer ([place])     HX skin cancer removed    Past Surgical History  Procedure Laterality Date  . Cholecystectomy    . Uterine ablation       Current outpatient prescriptions:  .  albuterol (PROVENTIL HFA;VENTOLIN HFA) 108 (90 BASE) MCG/ACT inhaler, Inhale 2 puffs into the lungs every 6 (six) hours as needed for wheezing or shortness of breath., Disp: , Rfl:  .  Bepotastine Besilate (BEPREVE) 1.5 % SOLN, Place 2 drops into both eyes daily. , Disp: , Rfl:  .  budesonide-formoterol (SYMBICORT) 80-4.5 MCG/ACT inhaler, Inhale 2 puffs into the lungs 2 (two) times daily as needed (asthma)., Disp: , Rfl:  .  HYDROcodone-acetaminophen (NORCO) 7.5-325 MG tablet, take 1 tablet by mouth three times a day if needed  for pain, Disp: , Rfl: 0 .  lisinopril-hydrochlorothiazide (PRINZIDE,ZESTORETIC) 10-12.5 MG per tablet, Take 1 tablet by mouth daily. , Disp: , Rfl:  .  QNASL 80 MCG/ACT AERS, Place 2 puffs into the nose daily. , Disp: , Rfl:   Allergies  Allergen Reactions  . Adhesive [Tape]     Can tolerate paper tape; blisters other tape  . Other     chlorox-- hard to breathe, triggers asthma  . Sulfa Antibiotics Swelling and Rash  . Sulfamethoxazole Rash    Social History  Substance Use Topics  . Smoking status: Never Smoker   . Smokeless tobacco: No  . Alcohol Use: No      Review of Systems  Constitutional: Positive for malaise/fatigue. Negative for fever, chills, weight loss and diaphoresis.  HENT: Negative.   Eyes: Negative.   Respiratory: Positive for shortness of breath. Negative for cough, hemoptysis, sputum production and wheezing.        SOB on exertion  Cardiovascular: Negative.   Gastrointestinal: Negative.   Genitourinary: Negative.   Musculoskeletal: Positive for myalgias and joint pain. Negative for back pain, falls and neck pain.       Right hip pain  Skin: Negative.   Neurological: Negative.  Negative for weakness.  Endo/Heme/Allergies: Negative.   Psychiatric/Behavioral: Negative.     Objective:  Physical Exam  Constitutional: She is oriented to person, place, and time. She appears well-developed. No distress.  Obese  HENT:  Head: Normocephalic and atraumatic.  Right Ear: External ear normal.  Left  Ear: External ear normal.  Nose: Nose normal.  Mouth/Throat: Oropharynx is clear and moist.  Eyes: Conjunctivae and EOM are normal.  Neck: Normal range of motion. Neck supple.  Cardiovascular: Normal rate, regular rhythm, normal heart sounds and intact distal pulses.   No murmur heard. Respiratory: Effort normal and breath sounds normal. No respiratory distress. She has no wheezes.  GI: Soft. Bowel sounds are normal. She exhibits no distension. There is no  tenderness.  Musculoskeletal:       Right hip: She exhibits decreased range of motion and crepitus.       Left hip: Normal.       Right knee: Normal.       Left knee: Normal.  Her right hip could be flexed about 90, minimal internal rotation, about 10 to 20 degrees of external rotation, 20 degrees abduction.  Neurological: She is alert and oriented to person, place, and time. She has normal strength and normal reflexes. No sensory deficit.  Skin: No rash noted. She is not diaphoretic. No erythema.  Psychiatric: She has a normal Stanton and affect. Her behavior is normal.    Vitals  Weight: 230 lb Height: 63in Body Surface Area: 2.05 m Body Mass Index: 40.74 kg/m  Pulse: 72 (Regular)  BP: 126/88 (Sitting, Left Arm, Standard)  Imaging Review Plain radiographs demonstrate severe degenerative joint disease of the right hip(s). The bone quality appears to be good for age and reported activity level.  Assessment/Plan:  End stage primary osteoarthritis, right hip(s)  The patient history, physical examination, clinical judgement of the provider and imaging studies are consistent with end stage degenerative joint disease of the right hip(s) and total hip arthroplasty is deemed medically necessary. The treatment options including medical management, injection therapy, arthroscopy and arthroplasty were discussed at length. The risks and benefits of total hip arthroplasty were presented and reviewed. The risks due to aseptic loosening, infection, stiffness, dislocation/subluxation,  thromboembolic complications and other imponderables were discussed.  The patient acknowledged the explanation, agreed to proceed with the plan and consent was signed. Patient is being admitted for inpatient treatment for surgery, pain control, PT, OT, prophylactic antibiotics, VTE prophylaxis, progressive ambulation and ADL's and discharge planning.The patient is planning to be discharged home with home health  services    PCP: Dr. Gust Brooms, PA-C

## 2015-05-19 ENCOUNTER — Encounter (HOSPITAL_COMMUNITY)
Admission: RE | Disposition: A | Discharge status: Home Health | Payer: Self-pay | Source: Ambulatory Visit | Attending: Orthopedic Surgery

## 2015-05-19 ENCOUNTER — Inpatient Hospital Stay (HOSPITAL_COMMUNITY): Payer: BC Managed Care – PPO | Admitting: Anesthesiology

## 2015-05-19 ENCOUNTER — Encounter (HOSPITAL_COMMUNITY): Payer: Self-pay | Admitting: *Deleted

## 2015-05-19 ENCOUNTER — Inpatient Hospital Stay (HOSPITAL_COMMUNITY)
Admission: RE | Admit: 2015-05-19 | Discharge: 2015-05-21 | DRG: 470 | Disposition: A | Discharge status: Home Health | Payer: BC Managed Care – PPO | Source: Ambulatory Visit | Attending: Orthopedic Surgery | Admitting: Orthopedic Surgery

## 2015-05-19 ENCOUNTER — Inpatient Hospital Stay (HOSPITAL_COMMUNITY): Payer: BC Managed Care – PPO

## 2015-05-19 DIAGNOSIS — Z96649 Presence of unspecified artificial hip joint: Secondary | ICD-10-CM

## 2015-05-19 DIAGNOSIS — J45909 Unspecified asthma, uncomplicated: Secondary | ICD-10-CM | POA: Diagnosis present

## 2015-05-19 DIAGNOSIS — Z01812 Encounter for preprocedural laboratory examination: Secondary | ICD-10-CM

## 2015-05-19 DIAGNOSIS — M1611 Unilateral primary osteoarthritis, right hip: Principal | ICD-10-CM | POA: Diagnosis present

## 2015-05-19 DIAGNOSIS — I10 Essential (primary) hypertension: Secondary | ICD-10-CM | POA: Diagnosis present

## 2015-05-19 DIAGNOSIS — M169 Osteoarthritis of hip, unspecified: Secondary | ICD-10-CM | POA: Diagnosis present

## 2015-05-19 DIAGNOSIS — Z6841 Body Mass Index (BMI) 40.0 and over, adult: Secondary | ICD-10-CM | POA: Diagnosis not present

## 2015-05-19 DIAGNOSIS — M1612 Unilateral primary osteoarthritis, left hip: Secondary | ICD-10-CM

## 2015-05-19 DIAGNOSIS — Z79899 Other long term (current) drug therapy: Secondary | ICD-10-CM

## 2015-05-19 DIAGNOSIS — M25551 Pain in right hip: Secondary | ICD-10-CM | POA: Diagnosis present

## 2015-05-19 HISTORY — PX: TOTAL HIP ARTHROPLASTY: SHX124

## 2015-05-19 LAB — TYPE AND SCREEN
ABO/RH(D): O POS
Antibody Screen: NEGATIVE

## 2015-05-19 SURGERY — ARTHROPLASTY, HIP, TOTAL, ANTERIOR APPROACH
Anesthesia: Monitor Anesthesia Care | Site: Hip | Laterality: Right

## 2015-05-19 MED ORDER — PROPOFOL 10 MG/ML IV BOLUS
INTRAVENOUS | Status: AC
  Filled 2015-05-19: qty 40

## 2015-05-19 MED ORDER — PROMETHAZINE HCL 25 MG/ML IJ SOLN: 6.2500 mg | INTRAMUSCULAR | Status: DC | PRN

## 2015-05-19 MED ORDER — OXYCODONE HCL 5 MG PO TABS
5.0000 mg | ORAL_TABLET | ORAL | Status: DC | PRN
  Administered 2015-05-20 – 2015-05-21 (×10): 10 mg via ORAL
  Filled 2015-05-19 (×10): qty 2

## 2015-05-19 MED ORDER — METOCLOPRAMIDE HCL 5 MG/ML IJ SOLN: 5.0000 mg | Freq: Three times a day (TID) | INTRAMUSCULAR | Status: DC | PRN

## 2015-05-19 MED ORDER — FENTANYL CITRATE (PF) 100 MCG/2ML IJ SOLN
25.0000 ug | INTRAMUSCULAR | Status: DC | PRN
  Administered 2015-05-19 (×2): 50 ug via INTRAVENOUS

## 2015-05-19 MED ORDER — BISACODYL 10 MG RE SUPP: 10.0000 mg | Freq: Every day | RECTAL | Status: DC | PRN

## 2015-05-19 MED ORDER — DEXAMETHASONE SODIUM PHOSPHATE 10 MG/ML IJ SOLN
10.0000 mg | Freq: Once | INTRAMUSCULAR | Status: AC
Start: 2015-05-20 — End: 2015-05-20
  Administered 2015-05-20: 10 mg via INTRAVENOUS
  Filled 2015-05-19: qty 1

## 2015-05-19 MED ORDER — LIDOCAINE HCL (CARDIAC) 20 MG/ML IV SOLN
INTRAVENOUS | Status: AC
  Filled 2015-05-19: qty 5

## 2015-05-19 MED ORDER — OLOPATADINE HCL 0.1 % OP SOLN
1.0000 [drp] | Freq: Two times a day (BID) | OPHTHALMIC | Status: DC
  Administered 2015-05-21: 1 [drp] via OPHTHALMIC
  Filled 2015-05-19: qty 5

## 2015-05-19 MED ORDER — VANCOMYCIN HCL 10 G IV SOLR
1500.0000 mg | Freq: Once | INTRAVENOUS | Status: AC
  Administered 2015-05-19: 1500 mg via INTRAVENOUS
  Filled 2015-05-19: qty 1500

## 2015-05-19 MED ORDER — MIDAZOLAM HCL 2 MG/2ML IJ SOLN
INTRAMUSCULAR | Status: AC
  Filled 2015-05-19: qty 2

## 2015-05-19 MED ORDER — ACETAMINOPHEN 650 MG RE SUPP: 650.0000 mg | Freq: Four times a day (QID) | RECTAL | Status: DC | PRN

## 2015-05-19 MED ORDER — ONDANSETRON HCL 4 MG/2ML IJ SOLN: 4.0000 mg | Freq: Four times a day (QID) | INTRAMUSCULAR | Status: DC | PRN

## 2015-05-19 MED ORDER — ACETAMINOPHEN 10 MG/ML IV SOLN
INTRAVENOUS | Status: AC
  Filled 2015-05-19: qty 100

## 2015-05-19 MED ORDER — PHENYLEPHRINE 40 MCG/ML (10ML) SYRINGE FOR IV PUSH (FOR BLOOD PRESSURE SUPPORT)
PREFILLED_SYRINGE | INTRAVENOUS | Status: AC
  Filled 2015-05-19: qty 10

## 2015-05-19 MED ORDER — METHOCARBAMOL 500 MG PO TABS
500.0000 mg | ORAL_TABLET | Freq: Four times a day (QID) | ORAL | Status: DC | PRN
  Administered 2015-05-20 – 2015-05-21 (×4): 500 mg via ORAL
  Filled 2015-05-19 (×4): qty 1

## 2015-05-19 MED ORDER — BUDESONIDE-FORMOTEROL FUMARATE 80-4.5 MCG/ACT IN AERO: 2.0000 | INHALATION_SPRAY | Freq: Two times a day (BID) | RESPIRATORY_TRACT | Status: DC | PRN

## 2015-05-19 MED ORDER — KETOROLAC TROMETHAMINE 15 MG/ML IJ SOLN
7.5000 mg | Freq: Four times a day (QID) | INTRAMUSCULAR | Status: AC | PRN
  Administered 2015-05-19: 7.5 mg via INTRAVENOUS

## 2015-05-19 MED ORDER — MIDAZOLAM HCL 5 MG/5ML IJ SOLN
INTRAMUSCULAR | Status: DC | PRN
  Administered 2015-05-19: 2 mg via INTRAVENOUS

## 2015-05-19 MED ORDER — FLEET ENEMA 7-19 GM/118ML RE ENEM: 1.0000 | ENEMA | Freq: Once | RECTAL | Status: DC | PRN

## 2015-05-19 MED ORDER — LIP MEDEX EX OINT
TOPICAL_OINTMENT | CUTANEOUS | Status: AC
  Administered 2015-05-19: 21:00:00
  Filled 2015-05-19: qty 7

## 2015-05-19 MED ORDER — ROCURONIUM BROMIDE 100 MG/10ML IV SOLN
INTRAVENOUS | Status: AC
  Filled 2015-05-19: qty 1

## 2015-05-19 MED ORDER — DEXAMETHASONE SODIUM PHOSPHATE 10 MG/ML IJ SOLN
INTRAMUSCULAR | Status: AC
  Filled 2015-05-19: qty 1

## 2015-05-19 MED ORDER — ONDANSETRON HCL 4 MG/2ML IJ SOLN
INTRAMUSCULAR | Status: AC
  Filled 2015-05-19: qty 2

## 2015-05-19 MED ORDER — KETOROLAC TROMETHAMINE 15 MG/ML IJ SOLN
INTRAMUSCULAR | Status: AC
  Filled 2015-05-19: qty 1

## 2015-05-19 MED ORDER — HYDROMORPHONE HCL 1 MG/ML IJ SOLN
INTRAMUSCULAR | Status: DC | PRN
  Administered 2015-05-19 (×2): 1 mg via INTRAVENOUS

## 2015-05-19 MED ORDER — DIPHENHYDRAMINE HCL 12.5 MG/5ML PO ELIX: 12.5000 mg | ORAL_SOLUTION | ORAL | Status: DC | PRN

## 2015-05-19 MED ORDER — TRANEXAMIC ACID 1000 MG/10ML IV SOLN
1000.0000 mg | INTRAVENOUS | Status: AC
  Administered 2015-05-19: 1000 mg via INTRAVENOUS
  Filled 2015-05-19: qty 10

## 2015-05-19 MED ORDER — CHLORHEXIDINE GLUCONATE 4 % EX LIQD: 60.0000 mL | Freq: Once | CUTANEOUS | Status: DC

## 2015-05-19 MED ORDER — TRAMADOL HCL 50 MG PO TABS: 50.0000 mg | ORAL_TABLET | Freq: Four times a day (QID) | ORAL | Status: DC | PRN

## 2015-05-19 MED ORDER — ONDANSETRON HCL 4 MG/2ML IJ SOLN
INTRAMUSCULAR | Status: DC | PRN
  Administered 2015-05-19: 4 mg via INTRAVENOUS

## 2015-05-19 MED ORDER — 0.9 % SODIUM CHLORIDE (POUR BTL) OPTIME
TOPICAL | Status: DC | PRN
  Administered 2015-05-19: 1000 mL

## 2015-05-19 MED ORDER — FENTANYL CITRATE (PF) 100 MCG/2ML IJ SOLN
INTRAMUSCULAR | Status: DC | PRN
  Administered 2015-05-19 (×4): 50 ug via INTRAVENOUS

## 2015-05-19 MED ORDER — MORPHINE SULFATE (PF) 2 MG/ML IV SOLN: 1.0000 mg | INTRAVENOUS | Status: DC | PRN

## 2015-05-19 MED ORDER — FLUTICASONE PROPIONATE 50 MCG/ACT NA SUSP
2.0000 | Freq: Every day | NASAL | Status: DC
  Administered 2015-05-21: 2 via NASAL
  Filled 2015-05-19: qty 16

## 2015-05-19 MED ORDER — ONDANSETRON HCL 4 MG PO TABS: 4.0000 mg | ORAL_TABLET | Freq: Four times a day (QID) | ORAL | Status: DC | PRN

## 2015-05-19 MED ORDER — ALBUTEROL SULFATE (2.5 MG/3ML) 0.083% IN NEBU: 3.0000 mL | INHALATION_SOLUTION | Freq: Four times a day (QID) | RESPIRATORY_TRACT | Status: DC | PRN

## 2015-05-19 MED ORDER — FENTANYL CITRATE (PF) 100 MCG/2ML IJ SOLN
INTRAMUSCULAR | Status: AC
  Filled 2015-05-19: qty 2

## 2015-05-19 MED ORDER — HYDROMORPHONE HCL 1 MG/ML IJ SOLN
INTRAMUSCULAR | Status: AC
  Administered 2015-05-19: 0.5 mg via INTRAVENOUS
  Filled 2015-05-19: qty 1

## 2015-05-19 MED ORDER — BUPIVACAINE HCL (PF) 0.25 % IJ SOLN
INTRAMUSCULAR | Status: AC
  Filled 2015-05-19: qty 30

## 2015-05-19 MED ORDER — ACETAMINOPHEN 500 MG PO TABS
1000.0000 mg | ORAL_TABLET | Freq: Four times a day (QID) | ORAL | Status: AC
  Administered 2015-05-20 (×2): 1000 mg via ORAL
  Filled 2015-05-19 (×2): qty 2

## 2015-05-19 MED ORDER — SUGAMMADEX SODIUM 500 MG/5ML IV SOLN
INTRAVENOUS | Status: DC | PRN
  Administered 2015-05-19: 500 mg via INTRAVENOUS

## 2015-05-19 MED ORDER — METOCLOPRAMIDE HCL 10 MG PO TABS
5.0000 mg | ORAL_TABLET | Freq: Three times a day (TID) | ORAL | Status: DC | PRN
  Filled 2015-05-19: qty 1

## 2015-05-19 MED ORDER — PHENOL 1.4 % MT LIQD: 1.0000 | OROMUCOSAL | Status: DC | PRN

## 2015-05-19 MED ORDER — CEFAZOLIN SODIUM-DEXTROSE 2-3 GM-% IV SOLR
INTRAVENOUS | Status: AC
  Filled 2015-05-19: qty 50

## 2015-05-19 MED ORDER — ACETAMINOPHEN 325 MG PO TABS: 650.0000 mg | ORAL_TABLET | Freq: Four times a day (QID) | ORAL | Status: DC | PRN

## 2015-05-19 MED ORDER — SUGAMMADEX SODIUM 500 MG/5ML IV SOLN
INTRAVENOUS | Status: AC
  Filled 2015-05-19: qty 5

## 2015-05-19 MED ORDER — HYDROMORPHONE HCL 2 MG/ML IJ SOLN
INTRAMUSCULAR | Status: AC
  Filled 2015-05-19: qty 1

## 2015-05-19 MED ORDER — POLYETHYLENE GLYCOL 3350 17 G PO PACK: 17.0000 g | PACK | Freq: Every day | ORAL | Status: DC | PRN

## 2015-05-19 MED ORDER — BUPIVACAINE HCL (PF) 0.25 % IJ SOLN
INTRAMUSCULAR | Status: DC | PRN
  Administered 2015-05-19: 30 mL

## 2015-05-19 MED ORDER — SUCCINYLCHOLINE CHLORIDE 20 MG/ML IJ SOLN
INTRAMUSCULAR | Status: DC | PRN
  Administered 2015-05-19: 140 mg via INTRAVENOUS

## 2015-05-19 MED ORDER — EPHEDRINE SULFATE 50 MG/ML IJ SOLN
INTRAMUSCULAR | Status: DC | PRN
  Administered 2015-05-19: 40 mg via INTRAVENOUS

## 2015-05-19 MED ORDER — DEXAMETHASONE SODIUM PHOSPHATE 10 MG/ML IJ SOLN
10.0000 mg | Freq: Once | INTRAMUSCULAR | Status: AC
  Administered 2015-05-19: 10 mg via INTRAVENOUS

## 2015-05-19 MED ORDER — PHENYLEPHRINE HCL 10 MG/ML IJ SOLN
INTRAMUSCULAR | Status: DC | PRN
  Administered 2015-05-19 (×3): 80 ug via INTRAVENOUS

## 2015-05-19 MED ORDER — PROPOFOL 10 MG/ML IV BOLUS
INTRAVENOUS | Status: DC | PRN
  Administered 2015-05-19: 20 mg via INTRAVENOUS
  Administered 2015-05-19: 140 mg via INTRAVENOUS
  Administered 2015-05-19: 20 mg via INTRAVENOUS

## 2015-05-19 MED ORDER — ROCURONIUM BROMIDE 100 MG/10ML IV SOLN
INTRAVENOUS | Status: DC | PRN
  Administered 2015-05-19: 40 mg via INTRAVENOUS

## 2015-05-19 MED ORDER — LACTATED RINGERS IV SOLN
INTRAVENOUS | Status: DC
  Administered 2015-05-19: 1000 mL via INTRAVENOUS
  Administered 2015-05-19 (×2): via INTRAVENOUS

## 2015-05-19 MED ORDER — FENTANYL CITRATE (PF) 100 MCG/2ML IJ SOLN
INTRAMUSCULAR | Status: AC
  Administered 2015-05-19: 50 ug via INTRAVENOUS
  Filled 2015-05-19: qty 2

## 2015-05-19 MED ORDER — CEFAZOLIN SODIUM-DEXTROSE 2-3 GM-% IV SOLR
2.0000 g | Freq: Four times a day (QID) | INTRAVENOUS | Status: AC
  Administered 2015-05-19 – 2015-05-20 (×2): 2 g via INTRAVENOUS
  Filled 2015-05-19 (×2): qty 50

## 2015-05-19 MED ORDER — DOCUSATE SODIUM 100 MG PO CAPS
100.0000 mg | ORAL_CAPSULE | Freq: Two times a day (BID) | ORAL | Status: DC
  Administered 2015-05-20 – 2015-05-21 (×3): 100 mg via ORAL

## 2015-05-19 MED ORDER — MENTHOL 3 MG MT LOZG: 1.0000 | LOZENGE | OROMUCOSAL | Status: DC | PRN

## 2015-05-19 MED ORDER — CEFAZOLIN SODIUM-DEXTROSE 2-3 GM-% IV SOLR
2.0000 g | INTRAVENOUS | Status: AC
  Administered 2015-05-19: 2 g via INTRAVENOUS

## 2015-05-19 MED ORDER — METHOCARBAMOL 1000 MG/10ML IJ SOLN
500.0000 mg | Freq: Four times a day (QID) | INTRAVENOUS | Status: DC | PRN
  Administered 2015-05-19 – 2015-05-20 (×2): 500 mg via INTRAVENOUS
  Filled 2015-05-19 (×4): qty 5

## 2015-05-19 MED ORDER — DEXTROSE-NACL 5-0.9 % IV SOLN
INTRAVENOUS | Status: DC
  Administered 2015-05-20: 05:00:00 via INTRAVENOUS

## 2015-05-19 MED ORDER — HYDROMORPHONE HCL 1 MG/ML IJ SOLN
0.2500 mg | INTRAMUSCULAR | Status: DC | PRN
  Administered 2015-05-19 (×2): 0.5 mg via INTRAVENOUS

## 2015-05-19 MED ORDER — ACETAMINOPHEN 10 MG/ML IV SOLN
1000.0000 mg | Freq: Once | INTRAVENOUS | Status: AC
  Administered 2015-05-19: 1000 mg via INTRAVENOUS
  Filled 2015-05-19: qty 100

## 2015-05-19 MED ORDER — SODIUM CHLORIDE 0.9 % IV SOLN: INTRAVENOUS | Status: DC

## 2015-05-19 MED ORDER — RIVAROXABAN 10 MG PO TABS
10.0000 mg | ORAL_TABLET | Freq: Every day | ORAL | Status: DC
  Administered 2015-05-20 – 2015-05-21 (×2): 10 mg via ORAL
  Filled 2015-05-19 (×3): qty 1

## 2015-05-19 SURGICAL SUPPLY — 35 items
BAG DECANTER FOR FLEXI CONT (MISCELLANEOUS) ×3 IMPLANT
BAG ZIPLOCK 12X15 (MISCELLANEOUS) IMPLANT
BLADE SAG 18X100X1.27 (BLADE) ×3 IMPLANT
CAPT HIP TOTAL 2 ×3 IMPLANT
CLOSURE WOUND 1/2 X4 (GAUZE/BANDAGES/DRESSINGS) ×1
CLOTH BEACON ORANGE TIMEOUT ST (SAFETY) ×3 IMPLANT
COVER PERINEAL POST (MISCELLANEOUS) ×3 IMPLANT
DECANTER SPIKE VIAL GLASS SM (MISCELLANEOUS) ×3 IMPLANT
DRAPE STERI IOBAN 125X83 (DRAPES) ×3 IMPLANT
DRAPE U-SHAPE 47X51 STRL (DRAPES) ×6 IMPLANT
DRSG ADAPTIC 3X8 NADH LF (GAUZE/BANDAGES/DRESSINGS) ×3 IMPLANT
DRSG MEPILEX BORDER 4X4 (GAUZE/BANDAGES/DRESSINGS) ×6 IMPLANT
DRSG MEPILEX BORDER 4X8 (GAUZE/BANDAGES/DRESSINGS) ×3 IMPLANT
DURAPREP 26ML APPLICATOR (WOUND CARE) ×3 IMPLANT
ELECT REM PT RETURN 9FT ADLT (ELECTROSURGICAL) ×3
ELECTRODE REM PT RTRN 9FT ADLT (ELECTROSURGICAL) ×1 IMPLANT
EVACUATOR 1/8 PVC DRAIN (DRAIN) ×3 IMPLANT
GLOVE BIO SURGEON STRL SZ7.5 (GLOVE) ×3 IMPLANT
GLOVE BIO SURGEON STRL SZ8 (GLOVE) ×6 IMPLANT
GLOVE BIOGEL PI IND STRL 8 (GLOVE) ×2 IMPLANT
GLOVE BIOGEL PI INDICATOR 8 (GLOVE) ×4
GOWN STRL REUS W/TWL LRG LVL3 (GOWN DISPOSABLE) ×3 IMPLANT
GOWN STRL REUS W/TWL XL LVL3 (GOWN DISPOSABLE) ×3 IMPLANT
PACK ANTERIOR HIP CUSTOM (KITS) ×3 IMPLANT
STRIP CLOSURE SKIN 1/2X4 (GAUZE/BANDAGES/DRESSINGS) ×2 IMPLANT
SUT ETHIBOND NAB CT1 #1 30IN (SUTURE) ×3 IMPLANT
SUT MNCRL AB 4-0 PS2 18 (SUTURE) ×3 IMPLANT
SUT VIC AB 2-0 CT1 27 (SUTURE) ×4
SUT VIC AB 2-0 CT1 TAPERPNT 27 (SUTURE) ×2 IMPLANT
SUT VLOC 180 0 24IN GS25 (SUTURE) ×6 IMPLANT
SYR 50ML LL SCALE MARK (SYRINGE) IMPLANT
TRAY FOLEY W/METER SILVER 14FR (SET/KITS/TRAYS/PACK) ×3 IMPLANT
TRAY FOLEY W/METER SILVER 16FR (SET/KITS/TRAYS/PACK) IMPLANT
WATER STERILE IRR 1000ML POUR (IV SOLUTION) ×3 IMPLANT
YANKAUER SUCT BULB TIP 10FT TU (MISCELLANEOUS) ×3 IMPLANT

## 2015-05-19 NOTE — Anesthesia Preprocedure Evaluation (Addendum)
Anesthesia Evaluation  Patient identified by MRN, date of birth, ID band Patient awake    Reviewed: Allergy & Precautions, NPO status , Patient's Chart, lab work & pertinent test results  History of Anesthesia Complications Negative for: history of anesthetic complications  Airway Mallampati: III  TM Distance: >3 FB Neck ROM: Full    Dental  (+) Teeth Intact, Dental Advisory Given   Pulmonary asthma ,    Pulmonary exam normal breath sounds clear to auscultation       Cardiovascular hypertension, Pt. on medications (-) angina(-) CAD and (-) Past MI Normal cardiovascular exam Rhythm:Regular Rate:Normal     Neuro/Psych negative neurological ROS  negative psych ROS   GI/Hepatic negative GI ROS, Neg liver ROS,   Endo/Other  Morbid obesity  Renal/GU negative Renal ROS     Musculoskeletal  (+) Arthritis , Osteoarthritis,    Abdominal   Peds  Hematology negative hematology ROS (+) Plt 328k   Anesthesia Other Findings Day of surgery medications reviewed with the patient.  Reproductive/Obstetrics negative OB ROS                           Anesthesia Physical Anesthesia Plan  ASA: III  Anesthesia Plan: Spinal and MAC   Post-op Pain Management:    Induction: Intravenous  Airway Management Planned:   Additional Equipment:   Intra-op Plan:   Post-operative Plan:   Informed Consent: I have reviewed the patients History and Physical, chart, labs and discussed the procedure including the risks, benefits and alternatives for the proposed anesthesia with the patient or authorized representative who has indicated his/her understanding and acceptance.   Dental advisory given  Plan Discussed with: CRNA  Anesthesia Plan Comments: (Will attempt spinal, backup GETA.)       Anesthesia Quick Evaluation

## 2015-05-19 NOTE — Anesthesia Postprocedure Evaluation (Signed)
Anesthesia Post Note  Patient: Anna Stanton  Procedure(s) Performed: Procedure(s) (LRB): RIGHT TOTAL HIP ARTHROPLASTY ANTERIOR APPROACH (Right)  Patient location during evaluation: PACU Anesthesia Type: General Level of consciousness: awake and alert Pain management: satisfactory to patient Vital Signs Assessment: post-procedure vital signs reviewed and stable Respiratory status: spontaneous breathing, nonlabored ventilation, respiratory function stable and patient connected to nasal cannula oxygen Cardiovascular status: blood pressure returned to baseline and stable Postop Assessment: no signs of nausea or vomiting Anesthetic complications: no    Last Vitals:  Filed Vitals:   05/19/15 1700 05/19/15 1715  BP: 149/71 145/92  Pulse: 97 100  Temp:    Resp: 7 12    Last Pain:  Filed Vitals:   05/19/15 1727  PainSc: 5         RLE Motor Response: Purposeful movement RLE Sensation: Full sensation      Catalina Gravel

## 2015-05-19 NOTE — Progress Notes (Signed)
6th floor  [facility]) notified pt will be in 1604 in 20 minutes.  Needs:  Isolation/pump and pulsox.

## 2015-05-19 NOTE — Interval H&P Note (Signed)
History and Physical Interval Note:  05/19/2015 2:00 PM  Anna Stanton  has presented today for surgery, with the diagnosis of OSTEOARTHRITIS RIGHT HIP  The various methods of treatment have been discussed with the patient and family. After consideration of risks, benefits and other options for treatment, the patient has consented to  Procedure(s): RIGHT TOTAL HIP ARTHROPLASTY ANTERIOR APPROACH (Right) as a surgical intervention .  The patient's history has been reviewed, patient examined, no change in status, stable for surgery.  I have reviewed the patient's chart and labs.  Questions were answered to the patient's satisfaction.     Gearlean Alf

## 2015-05-19 NOTE — Plan of Care (Signed)
Problem: Education: Goal: Knowledge of [place] [facility] Education information/materials will improve Outcome: Completed/Met Date Met:  05/19/15 Husband educated   Pt sleepy

## 2015-05-19 NOTE — Transfer of Care (Signed)
Immediate Anesthesia Transfer of Care Note  Patient: Anna Stanton  Procedure(s) Performed: Procedure(s): RIGHT TOTAL HIP ARTHROPLASTY ANTERIOR APPROACH (Right)  Patient Location: PACU  Anesthesia Type:General  Level of Consciousness:  sedated, patient cooperative and responds to stimulation  Airway & Oxygen Therapy:Patient Spontanous Breathing and Patient connected to face mask oxgen  Post-op Assessment:  Report given to PACU RN and Post -op Vital signs reviewed and stable  Post vital signs:  Reviewed and stable  Last Vitals: There were no vitals filed for this visit.  Complications: No apparent anesthesia complications

## 2015-05-19 NOTE — Op Note (Signed)
OPERATIVE REPORT  PREOPERATIVE DIAGNOSIS: Osteoarthritis of the Right hip.   POSTOPERATIVE DIAGNOSIS: Osteoarthritis of the Right  hip.   PROCEDURE: Right total hip arthroplasty, anterior approach.   SURGEON: Gaynelle Arabian, MD   ASSISTANT: Arlee Muslim, PA-C  ANESTHESIA:  General  ESTIMATED BLOOD LOSS:-900 ml    DRAINS: Hemovac x1.   COMPLICATIONS: None   CONDITION: PACU - hemodynamically stable.   BRIEF CLINICAL NOTE: Anna Stanton is a 59 y.o. female who has advanced end-  stage arthritis of her Right  hip with progressively worsening pain and  dysfunction.The patient has failed nonoperative management and presents for  total hip arthroplasty.   PROCEDURE IN DETAIL: After successful administration of spinal  anesthetic, the traction boots for the [facility] bed were placed on both  feet and the patient was placed onto the [facility] bed, boots placed into the leg  holders. The Right hip was then isolated from the perineum with plastic  drapes and prepped and draped in the usual sterile fashion. ASIS and  greater trochanter were marked and a oblique incision was made, starting  at about 1 cm lateral and 2 cm distal to the ASIS and coursing towards  the anterior cortex of the femur. The skin was cut with a 10 blade  through subcutaneous tissue to the level of the fascia overlying the  tensor fascia lata muscle. The fascia was then incised in line with the  incision at the junction of the anterior third and posterior 2/3rd. The  muscle was teased off the fascia and then the interval between the TFL  and the rectus was developed. The Hohmann retractor was then placed at  the top of the femoral neck over the capsule. The vessels overlying the  capsule were cauterized and the fat on top of the capsule was removed.  A Hohmann retractor was then placed anterior underneath the rectus  femoris to give exposure to the entire anterior capsule. A T-shaped  capsulotomy was  performed. The edges were tagged and the femoral head  was identified.       Osteophytes are removed off the superior acetabulum.  The femoral neck was then cut in situ with an oscillating saw. Traction  was then applied to the left lower extremity utilizing the [facility]  traction. The femoral head was then removed. Retractors were placed  around the acetabulum and then circumferential removal of the labrum was  performed. Osteophytes were also removed. Reaming starts at 43 mm to  medialize and  Increased in 2 mm increments to 47 mm. We reamed in  approximately 40 degrees of abduction, 20 degrees anteversion. A 48 mm  pinnacle acetabular shell was then impacted in anatomic position under  fluoroscopic guidance with excellent purchase. We did not need to place  any additional dome screws. A 28 mm neutral + 4 marathon liner was then  placed into the acetabular shell.       The femoral lift was then placed along the lateral aspect of the femur  just distal to the vastus ridge. The leg was  externally rotated and capsule  was stripped off the inferior aspect of the femoral neck down to the  level of the lesser trochanter, this was done with electrocautery. The femur was lifted after this was performed. The  leg was then placed and extended in adducted position to essentially delivering the femur. We also removed the capsule superiorly and the  piriformis from the  piriformis fossa to gain excellent exposure of the  proximal femur. Rongeur was used to remove some cancellous bone to get  into the lateral portion of the proximal femur for placement of the  initial starter reamer. The starter broaches was placed  the starter broach  and was shown to go down the center of the canal. Broaching  with the  Corail system was then performed starting at size 8, coursing  Up to size 9. A size 9 had excellent torsional and rotational  and axial stability. The trial high offset neck was then placed  with a 28 +  1.5 trial head. The hip was then reduced. We confirmed that  the stem was in the canal both on AP and lateral x-rays. It also has excellent sizing. The hip was reduced with outstanding stability through full extension, full external rotation,  and then flexion in adduction internal rotation. AP pelvis was taken  and the leg lengths were measured and found to be exactly equal. Hip  was then dislocated again and the femoral head and neck removed. The  femoral broach was removed. Size 9 Corail stem with a high offset  neck was then impacted into the femur following native anteversion. Has  excellent purchase in the canal. Excellent torsional and rotational and  axial stability. It is confirmed to be in the canal on AP and lateral  fluoroscopic views. The 28 + 1.5 ceramic head was placed and the hip  reduced with outstanding stability. Again AP pelvis was taken and it  confirmed that the leg lengths were equal. The wound was then copiously  irrigated with saline solution and the capsule reattached and repaired  with Ethibond suture.30 ml of .25% Bupivicaine was injected into the capsule and into the edge of the tensor fascia lata as well as subcutaneous tissue. The fascia overlying the tensor fascia lata was  then closed with a running #1 V-Loc. Subcu was closed with interrupted  2-0 Vicryl and subcuticular running 4-0 Monocryl. Incision was cleaned  and dried. Steri-Strips and a bulky sterile dressing applied. Hemovac  drain was hooked to suction and then he was awakened and transported to  recovery in stable condition.        Please note that a surgical assistant was a medical necessity for this procedure to perform it in a safe and expeditious manner. Assistant was necessary to provide appropriate retraction of vital neurovascular structures and to prevent femoral fracture and allow for anatomic placement of the prosthesis.  Gaynelle Arabian, M.D.

## 2015-05-19 NOTE — Anesthesia Procedure Notes (Addendum)
Spinal Patient location during procedure: OR Start time: 05/19/2015 2:10 PM End time: 05/19/2015 2:24 PM Staffing Anesthesiologist: Catalina Gravel Resident/CRNA: Lajuana Carry E Performed by: anesthesiologist  Preanesthetic Checklist Completed: patient identified, site marked, surgical consent, pre-op evaluation, timeout performed, IV checked, risks and benefits discussed and monitors and equipment checked Spinal Block Patient position: sitting Prep: Betadine Patient monitoring: heart rate, continuous pulse ox and blood pressure Approach: midline Location: L3-4 Injection technique: single-shot Needle Needle type: Spinocan  Needle gauge: 24 G Needle length: 10 cm Additional Notes Expiration date of kit checked and confirmed. Attempt x2 by CRNA w/o success. Attempts by Dr. Gifford Shave w/o success. SAB aborted, converted to GETA. Patient tolerated procedure well, without complications.    Procedure Name: Intubation Date/Time: 05/19/2015 2:28 PM Performed by: Lajuana Carry E Pre-anesthesia Checklist: Patient identified, Emergency Drugs available, Suction available and Patient being monitored Patient Re-evaluated:Patient Re-evaluated prior to inductionOxygen Delivery Method: Circle System Utilized Preoxygenation: Pre-oxygenation with 100% oxygen Intubation Type: IV induction Ventilation: Mask ventilation without difficulty Laryngoscope Size: Miller and 2 Grade View: Grade II Tube type: Oral Tube size: 7.0 mm Number of attempts: 1 Airway Equipment and Method: Stylet Placement Confirmation: ETT inserted through vocal cords under direct vision,  positive ETCO2 and breath sounds checked- equal and bilateral Secured at: 22 cm Tube secured with: Tape Dental Injury: Teeth and Oropharynx as per pre-operative assessment

## 2015-05-20 ENCOUNTER — Encounter (HOSPITAL_COMMUNITY): Payer: Self-pay | Admitting: Orthopedic Surgery

## 2015-05-20 LAB — CBC
HCT: 32.6 % — ABNORMAL LOW (ref 36.0–46.0)
HEMOGLOBIN: 10.5 g/dL — AB (ref 12.0–15.0)
MCH: 28.5 pg (ref 26.0–34.0)
MCHC: 32.2 g/dL (ref 30.0–36.0)
MCV: 88.3 fL (ref 78.0–100.0)
PLATELETS: 299 10*3/uL (ref 150–400)
RBC: 3.69 MIL/uL — AB (ref 3.87–5.11)
RDW: 13.7 % (ref 11.5–15.5)
WBC: 14.5 10*3/uL — AB (ref 4.0–10.5)

## 2015-05-20 LAB — BASIC METABOLIC PANEL
ANION GAP: 5 (ref 5–15)
BUN: 17 mg/dL (ref 6–20)
CHLORIDE: 104 mmol/L (ref 101–111)
CO2: 28 mmol/L (ref 22–32)
Calcium: 8.5 mg/dL — ABNORMAL LOW (ref 8.9–10.3)
Creatinine, Ser: 0.77 mg/dL (ref 0.44–1.00)
GFR calc Af Amer: >60 mL/min (ref >60)
GLUCOSE: 162 mg/dL — AB (ref 65–99)
POTASSIUM: 4.4 mmol/L (ref 3.5–5.1)
Sodium: 137 mmol/L (ref 135–145)

## 2015-05-20 MED ORDER — RIVAROXABAN 10 MG PO TABS: 10.0000 mg | ORAL_TABLET | Freq: Every day | ORAL | Status: AC

## 2015-05-20 MED ORDER — METHOCARBAMOL 500 MG PO TABS: 500.0000 mg | ORAL_TABLET | Freq: Four times a day (QID) | ORAL | Status: AC | PRN

## 2015-05-20 MED ORDER — OXYCODONE HCL 5 MG PO TABS: 5.0000 mg | ORAL_TABLET | ORAL | Status: AC | PRN

## 2015-05-20 MED ORDER — TRAMADOL HCL 50 MG PO TABS: 50.0000 mg | ORAL_TABLET | Freq: Four times a day (QID) | ORAL | Status: AC | PRN

## 2015-05-20 NOTE — Progress Notes (Signed)
OT Cancellation Note  Patient Details Name: Anna Stanton MRN: [id number] DOB: 1956-03-19   Cancelled Treatment:    Reason Eval/Treat Not Completed: Other (comment).  Pt just back to bed.  Doesn't want to move right now. Will check back later.    Zahli Vetsch 05/20/2015, 12:56 PM  Lesle Chris, OTR/L [phone] 05/20/2015

## 2015-05-20 NOTE — Progress Notes (Signed)
Physical Therapy Treatment Patient Details Name: Anna Stanton MRN: [id number] DOB: 1956/05/19 Today's Date: 05/29/15    History of Present Illness R THR    PT Comments    Pt cooperative but progressing slowly with mobility 2* ongoing R thigh pain/spasms.    Follow Up Recommendations  Home health PT     Equipment Recommendations  Rolling walker with 5" wheels    Recommendations for Other Services OT consult     Precautions / Restrictions Precautions Precautions: Fall Restrictions Weight Bearing Restrictions: No    Mobility  Bed Mobility           Sit to supine: Min assist   General bed mobility comments: assist for LLE  Transfers Overall transfer level: Needs assistance Equipment used: Rolling walker (2 wheeled) Transfers: Sit to/from Stand Sit to Stand: Min assist         General transfer comment: cues for LE management and use of UEs to self assist  Ambulation/Gait Ambulation/Gait assistance: Min assist Ambulation Distance (Feet): 64 Feet Assistive device: Rolling walker (2 wheeled) Gait Pattern/deviations: Step-to pattern;Decreased step length - right;Decreased step length - left;Shuffle;Trunk flexed Gait velocity: decr   General Gait Details: cues for sequence, posture and position from [employer]            Wheelchair Mobility    Modified Rankin (Stroke Patients Only)       Balance                                    Cognition Arousal/Alertness: Awake/alert Behavior During Therapy: WFL for tasks assessed/performed Overall Cognitive Status: Within Functional Limits for tasks assessed                      Exercises      General Comments        Pertinent Vitals/Pain Pain Assessment: 0-10 Pain Score: 5  Pain Location: R thigh/hip Pain Descriptors / Indicators: Aching;Spasm;Sore Pain Intervention(s): Limited activity within patient's tolerance;Monitored during session;Premedicated before session     Home Living Family/patient expects to be discharged to:: Private residence Living Arrangements: Spouse/significant other Available Help at Discharge: Family         Home Equipment: Gilford Rile - 4 wheels      Prior Function Level of Independence: Independent;Independent with assistive device(s)      Comments: 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, hospital   PT Goals (current goals can now be found in the care plan section) Acute Rehab PT Goals Patient Stated Goal: Walk with less pain PT Goal Formulation: With patient Time For Goal Achievement: 05/27/15 Potential to Achieve Goals: Good Progress towards PT goals: Progressing toward goals    Frequency  7X/week    PT Plan Current plan remains appropriate    Co-evaluation             End of Session Equipment Utilized During Treatment: Gait belt Activity Tolerance: Patient limited by pain Patient left: Other (comment) (in bathroom with OT)     Time: [id number] PT Time Calculation (min) (ACUTE ONLY): 19 min  Charges:  $Gait Training: 8-22 mins                    G Codes:      Irja Wheless May 29, 2015, 4:46 PM

## 2015-05-20 NOTE — Care Management Note (Signed)
Case Management Note  Patient Details  Name: GAYATRI GRIMSRUD MRN: [id number] Date of Birth: [birth year]  Subjective/Objective:     59 y.o. F admitted 05/19/2015 for R THA. Private residence with spouse pta.                Action/Plan: Discharge Home with HHPT through [facility]. [facility])    Expected Discharge Date:                  Expected Discharge Plan:  [place]  In-House Referral:     Discharge planning Services  CM Consult  Post Acute Care Choice:  Durable Medical Equipment Choice offered to:  Patient  DME Arranged:  Walker rolling DME Agency:  [place] Arranged:  PT [facility] Agency:  [place]  Status of Service:  In process, will continue to follow  Medicare Important Message Given:    Date Medicare IM Given:    Medicare IM give by:    Date Additional Medicare IM Given:    Additional Medicare Important Message give by:     If discussed at [place] of Stay Meetings, dates discussed:    Additional Comments:  Delrae Sawyers, RN 05/20/2015, 3:17 PM

## 2015-05-20 NOTE — Discharge Instructions (Addendum)
° °Dr. Frank Aluisio °Total Joint Specialist °[facility] °[address] °[place], [place] [address] °[phone] ° °ANTERIOR APPROACH TOTAL HIP REPLACEMENT POSTOPERATIVE DIRECTIONS ° ° °Hip Rehabilitation, Guidelines Following Surgery  °The results of a hip operation are greatly improved after range of motion and muscle strengthening exercises. Follow all safety measures which are given to protect your hip. If any of these exercises cause increased pain or swelling in your joint, decrease the amount until you are comfortable again. Then slowly increase the exercises. Call your caregiver if you have problems or questions.  ° °HOME CARE INSTRUCTIONS  °Remove items at home which could result in a fall. This includes throw rugs or furniture in walking pathways.  °· ICE to the affected hip every three hours for 30 minutes at a time and then as needed for pain and swelling.  Continue to use ice on the hip for pain and swelling from surgery. You may notice swelling that will progress down to the foot and ankle.  This is normal after surgery.  Elevate the leg when you are not up walking on it.   °· Continue to use the breathing machine which will help keep your temperature down.  It is common for your temperature to cycle up and down following surgery, especially at night when you are not up moving around and exerting yourself.  The breathing machine keeps your lungs expanded and your temperature down. ° ° °DIET °You may resume your previous home diet once your are discharged from the hospital. ° °DRESSING / WOUND CARE / SHOWERING °You may shower 3 days after surgery, but keep the wounds dry during showering.  You may use an occlusive plastic wrap (Press'n Seal for example), NO SOAKING/SUBMERGING IN THE BATHTUB.  If the bandage gets wet, change with a clean dry gauze.  If the incision gets wet, pat the wound dry with a clean towel. °You may start showering once you are discharged home but do not  submerge the incision under water. Just pat the incision dry and apply a dry gauze dressing on daily. °Change the surgical dressing daily and reapply a dry dressing each time. ° °ACTIVITY °Walk with your walker as instructed. °Use walker as long as suggested by your caregivers. °Avoid periods of inactivity such as sitting longer than an hour when not asleep. This helps prevent blood clots.  °You may resume a sexual relationship in one month or when given the OK by your doctor.  °You may return to work once you are cleared by your doctor.  °Do not drive a car for 6 weeks or until released by you surgeon.  °Do not drive while taking narcotics. ° °WEIGHT BEARING °Weight bearing as tolerated with assist device (walker, cane, etc) as directed, use it as long as suggested by your surgeon or therapist, typically at least 4-6 weeks. ° °POSTOPERATIVE CONSTIPATION PROTOCOL °Constipation - defined medically as fewer than three stools per week and severe constipation as less than one stool per week. ° °One of the most common issues patients have following surgery is constipation.  Even if you have a regular bowel pattern at home, your normal regimen is likely to be disrupted due to multiple reasons following surgery.  Combination of anesthesia, postoperative narcotics, change in appetite and fluid intake all can affect your bowels.  In order to avoid complications following surgery, here are some recommendations in order to help you during your recovery period. ° °Colace (docusate) - Pick up an over-the-counter   form of Colace or another stool softener and take twice a day as long as you are requiring postoperative pain medications.  Take with a full glass of water daily.  If you experience loose stools or diarrhea, hold the colace until you stool forms back up.  If your symptoms do not get better within 1 week or if they get worse, check with your doctor. ° °Dulcolax (bisacodyl) - Pick up over-the-counter and take as directed  by the product packaging as needed to assist with the movement of your bowels.  Take with a full glass of water.  Use this product as needed if not relieved by Colace only.  ° °MiraLax (polyethylene glycol) - Pick up over-the-counter to have on hand.  MiraLax is a solution that will increase the amount of water in your bowels to assist with bowel movements.  Take as directed and can mix with a glass of water, juice, soda, coffee, or tea.  Take if you go more than two days without a movement. °Do not use MiraLax more than once per day. Call your doctor if you are still constipated or irregular after using this medication for 7 days in a row. ° °If you continue to have problems with postoperative constipation, please contact the office for further assistance and recommendations.  If you experience "the worst abdominal pain ever" or develop nausea or vomiting, please contact the office immediatly for further recommendations for treatment. ° °ITCHING ° If you experience itching with your medications, try taking only a single pain pill, or even half a pain pill at a time.  You can also use Benadryl over the counter for itching or also to help with sleep.  ° °TED HOSE STOCKINGS °Wear the elastic stockings on both legs for three weeks following surgery during the day but you may remove then at night for sleeping. ° °MEDICATIONS °See your medication summary on the “After Visit Summary” that the nursing staff will review with you prior to discharge.  You may have some home medications which will be placed on hold until you complete the course of blood thinner medication.  It is important for you to complete the blood thinner medication as prescribed by your surgeon.  Continue your approved medications as instructed at time of discharge. ° °PRECAUTIONS °If you experience chest pain or shortness of breath - call 911 immediately for transfer to the hospital emergency department.  °If you develop a fever greater that 101 F,  purulent drainage from wound, increased redness or drainage from wound, foul odor from the wound/dressing, or calf pain - CONTACT YOUR SURGEON.   °                                                °FOLLOW-UP APPOINTMENTS °Make sure you keep all of your appointments after your operation with your surgeon and caregivers. You should call the office at the above phone number and make an appointment for approximately two weeks after the date of your surgery or on the date instructed by your surgeon outlined in the "After Visit Summary". ° °RANGE OF MOTION AND STRENGTHENING EXERCISES  °These exercises are designed to help you keep full movement of your hip joint. Follow your caregiver's or physical therapist's instructions. Perform all exercises about fifteen times, three times per day or as directed. Exercise both hips, even if you   have had only one joint replacement. These exercises can be done on a training (exercise) mat, on the floor, on a table or on a bed. Use whatever works the best and is most comfortable for you. Use music or television while you are exercising so that the exercises are a pleasant break in your day. This will make your life better with the exercises acting as a break in routine you can look forward to.  Lying on your back, slowly slide your foot toward your buttocks, raising your knee up off the floor. Then slowly slide your foot back down until your leg is straight again.  Lying on your back spread your legs as far apart as you can without causing discomfort.  Lying on your side, raise your upper leg and foot straight up from the floor as far as is comfortable. Slowly lower the leg and repeat.  Lying on your back, tighten up the muscle in the front of your thigh (quadriceps muscles). You can do this by keeping your leg straight and trying to raise your heel off the floor. This helps strengthen the largest muscle supporting your knee.  Lying on your back, tighten up the muscles of your  buttocks both with the legs straight and with the knee bent at a comfortable angle while keeping your heel on the floor.   IF YOU ARE TRANSFERRED TO A SKILLED REHAB FACILITY If the patient is transferred to a skilled rehab facility following release from the hospital, a list of the current medications will be sent to the facility for the patient to continue.  When discharged from the skilled rehab facility, please have the facility set up the patient's [place] prior to being released. Also, the skilled facility will be responsible for providing the patient with their medications at time of release from the facility to include their pain medication, the muscle relaxants, and their blood thinner medication. If the patient is still at the rehab facility at time of the two week follow up appointment, the skilled rehab facility will also need to assist the patient in arranging follow up appointment in our office and any transportation needs.  MAKE SURE YOU:  Understand these instructions.  Get help right away if you are not doing well or get worse.    Pick up stool softner and laxative for home use following surgery while on pain medications. Do not submerge incision under water. Please use good hand washing techniques while changing dressing each day. May shower starting three days after surgery. Please use a clean towel to pat the incision dry following showers. Continue to use ice for pain and swelling after surgery. Do not use any lotions or creams on the incision until instructed by your surgeon.  Take Xarelto for two and a half more weeks, then discontinue Xarelto. Once the patient has completed the blood thinner regimen, then take a Baby 81 mg Aspirin daily for three more weeks.   Information on my medicine - XARELTO (Rivaroxaban)  This medication education was reviewed with me or my healthcare representative as part of my discharge preparation.  The pharmacist that  spoke with me during my hospital stay was:  Luiz Ochoa [facility]  Why was Xarelto prescribed for you? Xarelto was prescribed for you to reduce the risk of blood clots forming after orthopedic surgery. The medical term for these abnormal blood clots is venous thromboembolism (VTE).  What do you need to know about xarelto ? Take your Xarelto  DAILY at the same time every day. °You may take it either with or without food. ° °If you have difficulty swallowing the tablet whole, you may crush it and mix in applesauce just prior to taking your dose. ° °Take Xarelto® exactly as prescribed by your doctor and DO NOT stop taking Xarelto® without talking to the doctor who prescribed the medication.  Stopping without other VTE prevention medication to take the place of Xarelto® may increase your risk of developing a clot. ° °After discharge, you should have regular check-up appointments with your healthcare provider that is prescribing your Xarelto®.   ° °What do you do if you miss a dose? °If you miss a dose, take it as soon as you remember on the same day then continue your regularly scheduled once daily regimen the next day. Do not take two doses of Xarelto® on the same day.  ° °Important Safety Information °A possible side effect of Xarelto® is bleeding. You should call your healthcare provider right away if you experience any of the following: °? Bleeding from an injury or your nose that does not stop. °? Unusual colored urine (red or dark brown) or unusual colored stools (red or black). °? Unusual bruising for unknown reasons. °? A serious fall or if you hit your head (even if there is no bleeding). ° °Some medicines may interact with Xarelto® and might increase your risk of bleeding while on Xarelto®. To help avoid this, consult your healthcare provider or pharmacist prior to using any new prescription or non-prescription medications, including herbals, vitamins, non-steroidal anti-inflammatory drugs  (NSAIDs) and supplements. ° °This website has more information on Xarelto®: www.xarelto.com. ° ° ° ° °

## 2015-05-20 NOTE — Discharge Summary (Signed)
Physician Discharge Summary   Patient ID: Anna Stanton MRN: [id number] DOB/AGE: 57-Nov-1957 57 y.o.  Admit date: 05/19/2015 Discharge date: 05-21-2015  Primary Diagnosis:  Osteoarthritis of the Right hip.   Admission Diagnoses:  Past Medical History  Diagnosis Date  . OA (osteoarthritis)   . Hypertension   . Asthma   . H/O: rheumatic fever     AS CHILD  . Cancer ([place])     HX skin cancer removed   Discharge Diagnoses:   Principal Problem:   OA (osteoarthritis) of hip Active Problems:   Morbid obesity ([place])  Estimated body mass index is 46.25 kg/(m^2) as calculated from the following:   Height as of this encounter: _0  (1.6 m).   Weight as of this encounter: 118.389 kg (261 lb).  Procedure(s) (LRB): RIGHT TOTAL HIP ARTHROPLASTY ANTERIOR APPROACH (Right)   Consults: None  HPI: Anna Stanton is a 59 y.o. female who has advanced end-  stage arthritis of her Right hip with progressively worsening pain and  dysfunction.The patient has failed nonoperative management and presents for  total hip arthroplasty.   Laboratory Data: Admission on 05/19/2015  Component Date Value Ref Range Status  . WBC 05/20/2015 14.5* 4.0 - 10.5 K/uL Final  . RBC 05/20/2015 3.69* 3.87 - 5.11 MIL/uL Final  . Hemoglobin 05/20/2015 10.5* 12.0 - 15.0 g/dL Final  . HCT 05/20/2015 32.6* 36.0 - 46.0 % Final  . MCV 05/20/2015 88.3  78.0 - 100.0 fL Final  . MCH 05/20/2015 28.5  26.0 - 34.0 pg Final  . MCHC 05/20/2015 32.2  30.0 - 36.0 g/dL Final  . RDW 05/20/2015 13.7  11.5 - 15.5 % Final  . Platelets 05/20/2015 299  150 - 400 K/uL Final  . Sodium 05/20/2015 137  135 - 145 mmol/L Final  . Potassium 05/20/2015 4.4  3.5 - 5.1 mmol/L Final  . Chloride 05/20/2015 104  101 - 111 mmol/L Final  . CO2 05/20/2015 28  22 - 32 mmol/L Final  . Glucose, Bld 05/20/2015 162* 65 - 99 mg/dL Final  . BUN 05/20/2015 17  6 - 20 mg/dL Final  . Creatinine, Ser 05/20/2015 0.77  0.44 - 1.00 mg/dL Final  .  Calcium 05/20/2015 8.5* 8.9 - 10.3 mg/dL Final  . GFR calc non Af Amer 05/20/2015 >60  >60 mL/min Final  . GFR calc Af Amer 05/20/2015 >60  >60 mL/min Final   Comment: (NOTE) The eGFR has been calculated using the CKD EPI equation. This calculation has not been validated in all clinical situations. eGFR's persistently <60 mL/min signify possible Chronic Kidney Disease.   Anna Stanton gap 05/20/2015 5  5 - 15 Final  Hospital Outpatient Visit on 05/11/2015  Component Date Value Ref Range Status  . aPTT 05/11/2015 26  24 - 37 seconds Final  . WBC 05/11/2015 9.1  4.0 - 10.5 K/uL Final  . RBC 05/11/2015 4.71  3.87 - 5.11 MIL/uL Final  . Hemoglobin 05/11/2015 13.5  12.0 - 15.0 g/dL Final  . HCT 05/11/2015 41.6  36.0 - 46.0 % Final  . MCV 05/11/2015 88.3  78.0 - 100.0 fL Final  . MCH 05/11/2015 28.7  26.0 - 34.0 pg Final  . MCHC 05/11/2015 32.5  30.0 - 36.0 g/dL Final  . RDW 05/11/2015 13.9  11.5 - 15.5 % Final  . Platelets 05/11/2015 328  150 - 400 K/uL Final  . Sodium 05/11/2015 140  135 - 145 mmol/L Final  . Potassium 05/11/2015 4.9  3.5 - 5.1  mmol/L Final  . Chloride 05/11/2015 104  101 - 111 mmol/L Final  . CO2 05/11/2015 29  22 - 32 mmol/L Final  . Glucose, Bld 05/11/2015 89  65 - 99 mg/dL Final  . BUN 05/11/2015 18  6 - 20 mg/dL Final  . Creatinine, Ser 05/11/2015 0.64  0.44 - 1.00 mg/dL Final  . Calcium 05/11/2015 9.4  8.9 - 10.3 mg/dL Final  . Total Protein 05/11/2015 7.2  6.5 - 8.1 g/dL Final  . Albumin 05/11/2015 3.6  3.5 - 5.0 g/dL Final  . AST 05/11/2015 24  15 - 41 U/L Final  . ALT 05/11/2015 21  14 - 54 U/L Final  . Alkaline Phosphatase 05/11/2015 82  38 - 126 U/L Final  . Total Bilirubin 05/11/2015 0.5  0.3 - 1.2 mg/dL Final  . GFR calc non Af Amer 05/11/2015 >60  >60 mL/min Final  . GFR calc Af Amer 05/11/2015 >60  >60 mL/min Final   Comment: (NOTE) The eGFR has been calculated using the CKD EPI equation. This calculation has not been validated in all clinical  situations. eGFR's persistently <60 mL/min signify possible Chronic Kidney Disease.   . Anion gap 05/11/2015 7  5 - 15 Final  . Prothrombin Time 05/11/2015 12.8  11.6 - 15.2 seconds Final  . INR 05/11/2015 0.94  0.00 - 1.49 Final  . ABO/RH(D) 05/11/2015 O POS   Final  . Antibody Screen 05/11/2015 NEG   Final  . Sample Expiration 05/11/2015 05/22/2015   Final  . Extend sample reason 05/11/2015 NO TRANSFUSIONS OR PREGNANCY IN THE PAST 3 MONTHS   Final  . Color, Urine 05/11/2015 YELLOW  YELLOW Final  . APPearance 05/11/2015 CLOUDY* CLEAR Final  . Specific Gravity, Urine 05/11/2015 1.018  1.005 - 1.030 Final  . pH 05/11/2015 5.5  5.0 - 8.0 Final  . Glucose, UA 05/11/2015 NEGATIVE  NEGATIVE mg/dL Final  . Hgb urine dipstick 05/11/2015 SMALL* NEGATIVE Final  . Bilirubin Urine 05/11/2015 NEGATIVE  NEGATIVE Final  . Ketones, ur 05/11/2015 NEGATIVE  NEGATIVE mg/dL Final  . Protein, ur 05/11/2015 NEGATIVE  NEGATIVE mg/dL Final  . Nitrite 05/11/2015 NEGATIVE  NEGATIVE Final  . Leukocytes, UA 05/11/2015 SMALL* NEGATIVE Final  . MRSA, PCR 05/11/2015 POSITIVE* NEGATIVE Final   Comment: RESULT CALLED TO, READ BACK BY AND VERIFIED WITH: [place] [id number] @ [place]   . Staphylococcus aureus 05/11/2015 POSITIVE* NEGATIVE Final   Comment:        The Xpert SA Assay (FDA approved for NASAL specimens in patients over 99 years of age), is one component of a comprehensive surveillance program.  Test performance has been validated by [facility] for patients greater than or equal to 51 year old. It is not intended to diagnose infection nor to guide or monitor treatment. RESULT CALLED TO, READ BACK BY AND VERIFIED WITH: [place] [id number] @ [address]   . Total Protein 05/11/2015 7.2  6.5 - 8.1 g/dL Final  . Albumin 05/11/2015 3.6  3.5 - 5.0 g/dL Final  . AST 05/11/2015 23  15 - 41 U/L Final  . ALT 05/11/2015 22  14 - 54 U/L Final  . Alkaline Phosphatase 05/11/2015 84  38  - 126 U/L Final  . Total Bilirubin 05/11/2015 0.6  0.3 - 1.2 mg/dL Final  . Bilirubin, Direct 05/11/2015 0.1  0.1 - 0.5 mg/dL Final  . Indirect Bilirubin 05/11/2015 0.5  0.3 - 0.9 mg/dL Final  . Squamous Epithelial / LPF  05/11/2015 0-5* NONE SEEN Final   Please note change in reference range.  . WBC, UA 05/11/2015 6-30  0 - 5 WBC/hpf Final   Please note change in reference range.  . RBC / HPF 05/11/2015 0-5  0 - 5 RBC/hpf Final   Please note change in reference range.  . Bacteria, UA 05/11/2015 MANY* NONE SEEN Final   Please note change in reference range.  . ABO/RH(D) 05/11/2015 O POS   Final     X-Rays:Dg Pelvis Portable  05/19/2015  CLINICAL DATA:  Post op right hip anterior approach EXAM: DG C-ARM 1-60 MIN-NO REPORT; PORTABLE PELVIS 1-2 VIEWS COMPARISON:  None. FINDINGS: Single frontal view of the pelvis submitted. There is right hip prosthesis with anatomic alignment. Postsurgical changes are noted with small amount of periarticular soft tissue air. Postsurgical drain in place. IMPRESSION: Right hip prosthesis with anatomic alignment. Fluoroscopy time 0.4 minutes.  Please see the operative report. Electronically Signed   By: Lahoma Crocker M.D.   On: 05/19/2015 17:05   Dg C-arm 1-60 Min-no Report  05/19/2015  CLINICAL DATA: surgery C-ARM 1-60 MINUTES Fluoroscopy was utilized by the requesting physician.  No radiographic interpretation.    EKG:No orders found for this or any previous visit.   Hospital Course: Patient was admitted to [facility] and taken to the OR and underwent the above state procedure without complications.  Patient tolerated the procedure well and was later transferred to the recovery room and then to the orthopaedic floor for postoperative care.  They were given PO and IV analgesics for pain control following their surgery.  They were given 24 hours of postoperative antibiotics of  Anti-infectives    Start     Dose/Rate Route Frequency Ordered Stop    05/19/15 2000  ceFAZolin (ANCEF) IVPB 2 g/50 mL premix     2 g 100 mL/hr over 30 Minutes Intravenous Every 6 hours 05/19/15 1820 05/20/15 0115   05/19/15 1330  vancomycin (VANCOCIN) 1,500 mg in sodium chloride 0.9 % 500 mL IVPB     1,500 mg 250 mL/hr over 120 Minutes Intravenous  Once 05/19/15 1319 05/19/15 1445   05/19/15 1146  ceFAZolin (ANCEF) IVPB 2 g/50 mL premix     2 g 100 mL/hr over 30 Minutes Intravenous On call to O.R. 05/19/15 1146 05/19/15 1502     and started on DVT prophylaxis in the form of Xarelto.   PT and OT were ordered for total hip protocol.  The patient was allowed to be WBAT with therapy. Discharge planning was consulted to help with postop disposition and equipment needs.  Patient had a decent night on the evening of surgery.  They started to get up OOB with therapy on day one.  Hemovac drain was pulled without difficulty.  Continued to work with therapy into day two.  Dressing was changed on day two and the incision was healing well. Patient was seen in rounds and was ready to go home on POD 2.  Discharge home with home health Diet - Cardiac diet Follow up - in 2 weeks on Tuesday 06/01/2015 Activity - WBAT Disposition - Home Condition Upon Discharge - improving. D/C Meds - See DC Summary DVT Prophylaxis - Xarelto  Discharge Instructions    Call MD / Call 911    Complete by:  As directed   If you experience chest pain or shortness of breath, CALL 911 and be transported to the hospital emergency room.  If you develope a fever above 101  F, pus (white drainage) or increased drainage or redness at the wound, or calf pain, call your surgeon's office.     Change dressing    Complete by:  As directed   You may change your dressing dressing daily with sterile 4 x 4 inch gauze dressing and paper tape.  Do not submerge the incision under water.     Constipation Prevention    Complete by:  As directed   Drink plenty of fluids.  Prune juice may be helpful.  You may use a  stool softener, such as Colace (over the counter) 100 mg twice a day.  Use MiraLax (over the counter) for constipation as needed.     Diet - low sodium heart healthy    Complete by:  As directed      Discharge instructions    Complete by:  As directed   Pick up stool softner and laxative for home use following surgery while on pain medications. Do not submerge incision under water. May remove the surgical dressing tomorrow, Friday 05/21/2015, and then apply a dry gauze dressing daily. Please use good hand washing techniques while changing dressing each day. May shower starting three days after surgery starting Saturday 05/22/2015. Please use a clean towel to pat the incision dry following showers. Continue to use ice for pain and swelling after surgery. Do not use any lotions or creams on the incision until instructed by your surgeon.  Postoperative Constipation Protocol  Constipation - defined medically as fewer than three stools per week and severe constipation as less than one stool per week.  One of the most common issues patients have following surgery is constipation. Even if you have a regular bowel pattern at home, your normal regimen is likely to be disrupted due to multiple reasons following surgery. Combination of anesthesia, postoperative narcotics, change in appetite and fluid intake all can affect your bowels. In order to avoid complications following surgery, here are some recommendations in order to help you during your recovery period.  Colace (docusate) - Pick up an over-the-counter form of Colace or another stool softener and take twice a day as long as you are requiring postoperative pain medications. Take with a full glass of water daily. If you experience loose stools or diarrhea, hold the colace until you stool forms back up. If your symptoms do not get better within 1 week or if they get worse, check with your doctor.  Dulcolax (bisacodyl) - Pick up over-the-counter  and take as directed by the product packaging as needed to assist with the movement of your bowels. Take with a full glass of water. Use this product as needed if not relieved by Colace only.   MiraLax (polyethylene glycol) - Pick up over-the-counter to have on hand. MiraLax is a solution that will increase the amount of water in your bowels to assist with bowel movements. Take as directed and can mix with a glass of water, juice, soda, coffee, or tea. Take if you go more than two days without a movement. Do not use MiraLax more than once per day. Call your doctor if you are still constipated or irregular after using this medication for 7 days in a row.  If you continue to have problems with postoperative constipation, please contact the office for further assistance and recommendations. If you experience "the worst abdominal pain ever" or develop nausea or vomiting, please contact the office immediatly for further recommendations for treatment.   Take Xarelto for two and a half  more weeks, then discontinue Xarelto. Once the patient has completed the blood thinner regimen, then take a Baby 81 mg Aspirin daily for three more weeks.     Do not sit on low chairs, stoools or toilet seats, as it may be difficult to get up from low surfaces    Complete by:  As directed      Driving restrictions    Complete by:  As directed   No driving until released by the physician.     Increase activity slowly as tolerated    Complete by:  As directed      Lifting restrictions    Complete by:  As directed   No lifting until released by the physician.     Patient may shower    Complete by:  As directed   You may shower without a dressing once there is no drainage.  Do not wash over the wound.  If drainage remains, do not shower until drainage stops.     TED hose    Complete by:  As directed   Use stockings (TED hose) for 3 weeks on both leg(s).  You may remove them at night for sleeping.     Weight  bearing as tolerated    Complete by:  As directed   Laterality:  left  Extremity:  Lower            Medication List    STOP taking these medications        HYDROcodone-acetaminophen 7.5-325 MG tablet  Commonly known as:  NORCO      TAKE these medications        albuterol 108 (90 BASE) MCG/ACT inhaler  Commonly known as:  PROVENTIL HFA;VENTOLIN HFA  Inhale 2 puffs into the lungs every 6 (six) hours as needed for wheezing or shortness of breath.     BEPREVE 1.5 % Soln  Generic drug:  Bepotastine Besilate  Place 2 drops into both eyes daily.     budesonide-formoterol 80-4.5 MCG/ACT inhaler  Commonly known as:  SYMBICORT  Inhale 2 puffs into the lungs 2 (two) times daily as needed (asthma).     lisinopril-hydrochlorothiazide 10-12.5 MG tablet  Commonly known as:  PRINZIDE,ZESTORETIC  Take 1 tablet by mouth daily.     methocarbamol 500 MG tablet  Commonly known as:  ROBAXIN  Take 1 tablet (500 mg total) by mouth every 6 (six) hours as needed for muscle spasms.     oxyCODONE 5 MG immediate release tablet  Commonly known as:  Oxy IR/ROXICODONE  Take 1-2 tablets (5-10 mg total) by mouth every 3 (three) hours as needed for moderate pain or severe pain.     QNASL 80 MCG/ACT Aers  Generic drug:  Beclomethasone Dipropionate  Place 2 puffs into the nose daily.     rivaroxaban 10 MG Tabs tablet  Commonly known as:  XARELTO  Take 1 tablet (10 mg total) by mouth daily with breakfast. Take Xarelto for two and a half more weeks, then discontinue Xarelto. Once the patient has completed the blood thinner regimen, then take a Baby 81 mg Aspirin daily for three more weeks.     traMADol 50 MG tablet  Commonly known as:  ULTRAM  Take 1-2 tablets (50-100 mg total) by mouth every 6 (six) hours as needed (mild pain).           Follow-up Information    Follow up with Gearlean Alf, MD On 06/01/2015.   Specialty:  Orthopedic Surgery  Why:  Call office at [phone] to setup  appointment on Tuesday 06/01/2015 with Dr. Wynelle Link.   Contact information:   [address] [place] [address] [phone]       Signed: Arlee Muslim, PA-C Orthopaedic Surgery 05/20/2015, 9:59 AM

## 2015-05-20 NOTE — Progress Notes (Signed)
   Subjective: 1 Day Post-Op Procedure(s) (LRB): RIGHT TOTAL HIP ARTHROPLASTY ANTERIOR APPROACH (Right) Patient reports pain as mild and moderate.   Patient seen in rounds for Dr. Wynelle Link.  It was pretty sore when she got up the first time. Patient is well, but has had some minor complaints of pain in the hip, requiring pain medications We will start therapy today.  If they do well with therapy and meets all goals, then will allow home later this afternoon following therapy. Plan is to go Home after hospital stay.  Objective: Vital signs in last 24 hours: Temp:  [97.8 F (36.6 C)-98.7 F (37.1 C)] 97.9 F (36.6 C) (12/01 0929) Pulse Rate:  [71-103] 72 (12/01 0929) Resp:  [7-18] 18 (12/01 0459) BP: (106-163)/(63-97) 118/63 mmHg (12/01 0929) SpO2:  [94 %-99 %] 98 % (12/01 0929) Weight:  [118.389 kg (261 lb)] 118.389 kg (261 lb) (11/30 1820)  Intake/Output from previous day:  Intake/Output Summary (Last 24 hours) at 05/20/15 0951 Last data filed at 05/20/15 0900  Gross per 24 hour  Intake   5950 ml  Output   2745 ml  Net   3205 ml    Intake/Output this shift: Total I/O In: 340 [P.O.:340] Out: 150 [Urine:150]  Labs:  Recent Labs  05/20/15 0454  HGB 10.5*    Recent Labs  05/20/15 0454  WBC 14.5*  RBC 3.69*  HCT 32.6*  PLT 299    Recent Labs  05/20/15 0454  NA 137  K 4.4  CL 104  CO2 28  BUN 17  CREATININE 0.77  GLUCOSE 162*  CALCIUM 8.5*   No results for input(s): LABPT, INR in the last 72 hours.  EXAM General - Patient is Alert, Appropriate and Oriented Extremity - Neurovascular intact Sensation intact distally Dorsiflexion/Plantar flexion intact Dressing - dressing C/D/I Motor Function - intact, moving foot and toes well on exam.  Hemovac pulled without difficulty.  Past Medical History  Diagnosis Date  . OA (osteoarthritis)   . Hypertension   . Asthma   . H/O: rheumatic fever     AS CHILD  . Cancer ([place])     HX skin cancer removed     Assessment/Plan: 1 Day Post-Op Procedure(s) (LRB): RIGHT TOTAL HIP ARTHROPLASTY ANTERIOR APPROACH (Right) Principal Problem:   OA (osteoarthritis) of hip Active Problems:   Morbid obesity ([place])  Estimated body mass index is 46.25 kg/(m^2) as calculated from the following:   Height as of this encounter: 5\' 3"  (1.6 m).   Weight as of this encounter: 118.389 kg (261 lb). Up with therapy Discharge home with home health  DVT Prophylaxis - Xarelto Weight Bearing As Tolerated right Leg Hemovac Pulled Begin Therapy  If meets goals and able to go home: Advance diet Up with therapy Discharge home with home health Diet - Cardiac diet Follow up - in 2 weeks Activity - WBAT Disposition - Home Condition Upon Discharge - pending D/C Meds - See DC Summary DVT Prophylaxis - Xarelto  Arlee Muslim, PA-C Orthopaedic Surgery 05/20/2015, 9:51 AM

## 2015-05-20 NOTE — Evaluation (Signed)
Occupational Therapy Evaluation Patient Details Name: Anna Stanton MRN: [id number] DOB: Jan 01, 1956 Today's Date: 05/20/2015    History of Present Illness R THR   Clinical Impression   This 59 year old female was admitted for the above surgery.  She was independent with ADLs prior to admission and currently needs up to max A for LB dressing. She will benefit from skilled OT in acute and goals are set for supervision level.    Follow Up Recommendations  Supervision/Assistance - 24 hour    Equipment Recommendations  3 in 1 bedside comode    Recommendations for Other Services       Precautions / Restrictions Precautions Precautions: Fall Restrictions Weight Bearing Restrictions: No      Mobility Bed Mobility           Sit to supine: Min assist   General bed mobility comments: assist for LLE  Transfers   Equipment used: Rolling walker (2 wheeled) Transfers: Sit to/from Stand Sit to Stand: Min assist         General transfer comment: cues for LE management and use of UEs to self assist    Balance                                            ADL Overall ADL's : Needs assistance/impaired     Grooming: Set up;Sitting   Upper Body Bathing: Set up;Sitting   Lower Body Bathing: Moderate assistance;Sit to/from stand   Upper Body Dressing : Set up;Sitting   Lower Body Dressing: Maximal assistance;Sit to/from stand   Toilet Transfer: Moderate assistance;Ambulation;BSC;RW   Toileting- Clothing Manipulation and Hygiene: Sit to/from stand;Moderate assistance         General ADL Comments: pt was in bathroom; completed ADL from toilet.  Pt has reacher at home:  will further educate on uses for ADLs at home.       Vision     Perception     Praxis      Pertinent Vitals/Pain Pain Score: 5  Pain Location: R hip/thigh Pain Descriptors / Indicators: Aching Pain Intervention(s): Limited activity within patient's tolerance;Monitored  during session;Premedicated before session;Repositioned;Ice applied     Hand Dominance     Extremity/Trunk Assessment Upper Extremity Assessment Upper Extremity Assessment: Overall WFL for tasks assessed           Communication Communication Communication: No difficulties   Cognition Arousal/Alertness: Awake/alert Behavior During Therapy: WFL for tasks assessed/performed Overall Cognitive Status: Within Functional Limits for tasks assessed                     General Comments       Exercises       Shoulder Instructions      Home Living Family/patient expects to be discharged to:: Private residence Living Arrangements: Spouse/significant other Available Help at Discharge: Family               Bathroom Shower/Tub: Walk-in Psychologist, prison and probation services: Standard     Home Equipment: Environmental consultant - 4 wheels          Prior Functioning/Environment Level of Independence: Independent;Independent with assistive device(s)        Comments: 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, hospital    OT Diagnosis: Generalized weakness   OT Problem List: Decreased strength;Decreased activity tolerance;Pain;Decreased knowledge of use of DME or  AE   OT Treatment/Interventions: Self-care/ADL training;DME and/or AE instruction;Patient/family education    OT Goals(Current goals can be found in the care plan section) Acute Rehab OT Goals Patient Stated Goal: Walk with less pain OT Goal Formulation: With patient Time For Goal Achievement: 05/27/15 Potential to Achieve Goals: Good ADL Goals Pt Will Perform Grooming: with supervision;standing Pt Will Perform Lower Body Bathing: with supervision;with adaptive equipment;sit to/from stand Pt Will Perform Lower Body Dressing: with supervision;with adaptive equipment;sit to/from stand (pants) Pt Will Transfer to Toilet: with supervision;ambulating;bedside commode Pt Will Perform Tub/Shower Transfer: Shower transfer;with  supervision;ambulating;3 in 1  OT Frequency: Min 2X/week   Barriers to D/C:            Co-evaluation              End of Session    Activity Tolerance: Patient tolerated treatment well Patient left: in bed;with call bell/phone within reach;with bed alarm set;with family/visitor present   Time: [id number] OT Time Calculation (min): 19 min Charges:  OT General Charges $OT Visit: 1 Procedure OT Evaluation $Initial OT Evaluation Tier I: 1 Procedure G-Codes:    Drakkar Medeiros June 16, 2015, 4:07 PM   Lesle Chris, OTR/L [phone] 06/16/2015

## 2015-05-20 NOTE — Evaluation (Signed)
Physical Therapy Evaluation Patient Details Name: Anna Stanton MRN: [id number] DOB: 12-28-1955 Today's Date: 05/20/2015   History of Present Illness  R THR  Clinical Impression  Pt s/p R THR presents with decreased R LE strength/ROM and post op pain limiting functional mobility.  Pt should progress to dc home with family assist and HHPT follow up.    Follow Up Recommendations Home health PT    Equipment Recommendations  Rolling walker with 5" wheels (wide RW)    Recommendations for Other Services OT consult     Precautions / Restrictions Precautions Precautions: Fall Restrictions Weight Bearing Restrictions: No      Mobility  Bed Mobility Overal bed mobility: Needs Assistance Bed Mobility: Sit to Supine       Sit to supine: Min assist;Mod assist   General bed mobility comments: cues for sequence and use of L LE to self assist  Transfers Overall transfer level: Needs assistance Equipment used: Rolling walker (2 wheeled) Transfers: Sit to/from Stand Sit to Stand: Min assist;Mod assist         General transfer comment: cues for LE management and use of UEs to self assist  Ambulation/Gait Ambulation/Gait assistance: Min assist;Mod assist Ambulation Distance (Feet): 38 Feet Assistive device: Rolling walker (2 wheeled) Gait Pattern/deviations: Step-to pattern;Decreased step length - right;Decreased step length - left;Shuffle;Trunk flexed Gait velocity: decr   General Gait Details: cues for sequence, posture and position from [employer]            Wheelchair Mobility    Modified Rankin (Stroke Patients Only)       Balance                                             Pertinent Vitals/Pain Pain Assessment: 0-10 Pain Score: 4  Pain Location: R thigh Pain Descriptors / Indicators: Aching;Spasm;Sore Pain Intervention(s): Limited activity within patient's tolerance;Monitored during session;Premedicated before session;Ice applied     Home Living Family/patient expects to be discharged to:: Private residence Living Arrangements: Spouse/significant other Available Help at Discharge: Family Type of Home: House Home Access: Stairs to enter Entrance Stairs-Rails: Right;Left;Can reach both Entrance Stairs-Number of Steps: 7 Home Layout: Two level Home Equipment: Walker - 4 wheels      Prior Function Level of Independence: Independent;Independent with assistive device(s)         Comments: 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        Extremity/Trunk Assessment   Upper Extremity Assessment: Overall WFL for tasks assessed           Lower Extremity Assessment: RLE deficits/detail RLE Deficits / Details: 2/5 strength at hip with AAROM at hip ltd by spasms to 60 flex and 20 abd    Cervical / Trunk Assessment: Normal  Communication   Communication: No difficulties  Cognition Arousal/Alertness: Awake/alert Behavior During Therapy: WFL for tasks assessed/performed Overall Cognitive Status: Within Functional Limits for tasks assessed                      General Comments      Exercises Total Joint Exercises Ankle Circles/Pumps: AROM;Both;15 reps;Supine Quad Sets: AROM;Both;10 reps;Supine Heel Slides: AAROM;Right;10 reps;Supine Hip ABduction/ADduction: AAROM;Right;10 reps;Supine      Assessment/Plan    PT Assessment Patient needs continued PT services  PT Diagnosis Difficulty walking  PT Problem List Decreased strength;Decreased range of motion;Decreased activity tolerance;Decreased mobility;Decreased knowledge of use of DME;Obesity;Pain  PT Treatment Interventions DME instruction;Gait training;Stair training;Functional mobility training;Therapeutic activities;Therapeutic exercise;Patient/family education   PT Goals (Current goals can be found in the Care Plan section) Acute Rehab PT Goals Patient Stated Goal: Walk with less pain PT Goal Formulation: With  patient Time For Goal Achievement: 05/27/15 Potential to Achieve Goals: Good    Frequency 7X/week   Barriers to discharge        Co-evaluation               End of Session Equipment Utilized During Treatment: Gait belt Activity Tolerance: Patient limited by pain;Other (comment) (spasms R thigh) Patient left: with call bell/phone within reach;in bed Nurse Communication: Mobility status         Time: [id number] PT Time Calculation (min) (ACUTE ONLY): 41 min   Charges:   PT Evaluation $Initial PT Evaluation Tier I: 1 Procedure PT Treatments $Gait Training: 8-22 mins $Therapeutic Exercise: 8-22 mins   PT G Codes:        Chele Cornell 2015/06/10, 1:03 PM

## 2015-05-21 LAB — CBC
HEMATOCRIT: 28.8 % — AB (ref 36.0–46.0)
HEMOGLOBIN: 9.3 g/dL — AB (ref 12.0–15.0)
MCH: 28.5 pg (ref 26.0–34.0)
MCHC: 32.3 g/dL (ref 30.0–36.0)
MCV: 88.3 fL (ref 78.0–100.0)
Platelets: 251 10*3/uL (ref 150–400)
RBC: 3.26 MIL/uL — ABNORMAL LOW (ref 3.87–5.11)
RDW: 14.1 % (ref 11.5–15.5)
WBC: 16 10*3/uL — ABNORMAL HIGH (ref 4.0–10.5)

## 2015-05-21 LAB — BASIC METABOLIC PANEL
Anion gap: 6 (ref 5–15)
BUN: 12 mg/dL (ref 6–20)
CHLORIDE: 105 mmol/L (ref 101–111)
CO2: 27 mmol/L (ref 22–32)
CREATININE: 0.53 mg/dL (ref 0.44–1.00)
Calcium: 8.5 mg/dL — ABNORMAL LOW (ref 8.9–10.3)
GFR calc Af Amer: >60 mL/min (ref >60)
GFR calc non Af Amer: >60 mL/min (ref >60)
Glucose, Bld: 127 mg/dL — ABNORMAL HIGH (ref 65–99)
Potassium: 3.9 mmol/L (ref 3.5–5.1)
Sodium: 138 mmol/L (ref 135–145)

## 2015-05-21 NOTE — Progress Notes (Signed)
Physical Therapy Treatment Patient Details Name: Anna Stanton MRN: [id number] DOB: 1956/02/26 Today's Date: 05/21/2015    History of Present Illness R THR    PT Comments    POD #2/PM Session  Performed stair training again with husband present - 4 steps with rail on L and crutch on R; pt. Less apprehensive/anxious; pt. Requiring 50% And husband requiring 25% VC on proper technique/lead leg - provided handout; ambulated ~20' with improvement on antalgic gait pattern with more weight shift and stance time to RLE; still presenting with shuffling/scrunching feet to advance RLE with c/o not being strong enough to lift it up    Follow Up Recommendations  Home health PT     Equipment Recommendations  Rolling walker with 5" wheels    Recommendations for Other Services OT consult     Precautions / Restrictions Precautions Precautions: Fall Restrictions Weight Bearing Restrictions: No Other Position/Activity Restrictions: WBAT    Mobility  Bed Mobility Overal bed mobility: Needs Assistance Bed Mobility: Sit to Supine       Sit to supine: Min assist;HOB elevated   General bed mobility comments: pt. utilizes hand rails to self assist trunk; requires min assist for navigation of RLE  Transfers Overall transfer level: Needs assistance Equipment used: Rolling walker (2 wheeled) Transfers: Sit to/from Stand Sit to Stand: Min guard;Min assist         General transfer comment: steadying assistance and 25% VC for proper hand placement   Ambulation/Gait Ambulation/Gait assistance: Min guard;Supervision Ambulation Distance (Feet): 20 Feet Assistive device: Rolling walker (2 wheeled) Gait Pattern/deviations: Step-to pattern;Shuffle;Decreased step length - left;Decreased stance time - right;Decreased weight shift to right;Antalgic Gait velocity: decreased   General Gait Details: step-to antalgic gait pattern with improvement on decreased stance time/weight shift to RLE and  decreased step length on L; shuffles/scrunches toes to advance RLE   Stairs Stairs: Yes Stairs assistance: Min assist Stair Management: One rail Left;With crutches;Forwards Number of Stairs: 4 General stair comments: performed stair training with husband; husband required 25% and pt. requires 50% VC for proper technique - provided handout  Wheelchair Mobility    Modified Rankin (Stroke Patients Only)       Balance                                    Cognition Arousal/Alertness: Awake/alert Behavior During Therapy: WFL for tasks assessed/performed;Anxious Overall Cognitive Status: Within Functional Limits for tasks assessed                      Exercises   General Comments        Pertinent Vitals/Pain Pain Assessment: 0-10 Pain Score: 6  Pain Location: R thigh/hip Pain Descriptors / Indicators: Sore;Cramping Pain Intervention(s): Limited activity within patient's tolerance;Monitored during session;Premedicated before session;Repositioned;Ice applied    Home Living                      Prior Function            PT Goals (current goals can now be found in the care plan section) Acute Rehab PT Goals Time For Goal Achievement: 05/27/15 Potential to Achieve Goals: Good Progress towards PT goals: Progressing toward goals    Frequency  7X/week    PT Plan Current plan remains appropriate    Co-evaluation             End of  Session Equipment Utilized During Treatment: Gait belt Activity Tolerance: Patient limited by pain Patient left: in chair;with call bell/phone within reach;with chair alarm set;with family/visitor present     Time: 1440-1510 PT Time Calculation (min) (ACUTE ONLY): 30 min  Charges:  $Gait Training: 8-22 mins $Therapeutic Activity: 8-22 mins                    G CodesDenna Haggard, SPTA   05/21/2015 4:13 PM   Pager: [phone]   Reviewed and agree with above Rica Koyanagi   PTA [facility]  Acute  Rehab Pager      [phone]

## 2015-05-21 NOTE — Progress Notes (Signed)
Occupational Therapy Treatment Patient Details Name: Anna Stanton MRN: [id number] DOB: 05-12-1956 Today's Date: 05/21/2015    History of present illness R THR   OT comments  Pt more tired today but willing to participate. Verbally reviewed and demonstrated shower transfer as pt did not feel up to practicing   Follow Up Recommendations  Supervision/Assistance - 24 hour    Equipment Recommendations  3 in 1 bedside comode    Recommendations for Other Services      Precautions / Restrictions Precautions Precautions: Fall Restrictions Weight Bearing Restrictions: No       Mobility Bed Mobility               General bed mobility comments: oob  Transfers   Equipment used: Rolling walker (2 wheeled) Transfers: Sit to/from Stand Sit to Stand: Min assist         General transfer comment: steadying assistance and cues for UE/LE placement    Balance                                   ADL                       Lower Body Dressing: Moderate assistance;Sit to/from stand;With adaptive equipment (reacher for underwear)   Toilet Transfer: Minimal assistance;Ambulation;BSC   Toileting- Clothing Manipulation and Hygiene: Minimal assistance;Sit to/from stand         General ADL Comments: pt has a reacher at home:  she was unable to lift RLE to get underwear over heel; educated on crossing LLE under R to lift up vs having assistance. Reviewed doffing sock also with reacher. Pt did not feel up to practicing shower transfer:  demonstrated and reviewed sequence. Pt more tired than yesterday and activities more effortful.  She had difficulty advancing RLE and externally rotated hip and inched foot forward.  Assisted with weightshifting to L      Vision                     Perception     Praxis      Cognition   Behavior During Therapy: [facility] for tasks assessed/performed Overall Cognitive Status: Within Functional Limits for tasks  assessed                       Extremity/Trunk Assessment               Exercises     Shoulder Instructions       General Comments      Pertinent Vitals/ Pain       Pain Score: 6  Pain Location: R thigh/hip Pain Descriptors / Indicators: Aching Pain Intervention(s): Limited activity within patient's tolerance;Monitored during session;Premedicated before session;Repositioned;Ice applied  Home Living                                          Prior Functioning/Environment              Frequency       Progress Toward Goals  OT Goals(current goals can now be found in the care plan section)  Progress towards OT goals: Progressing toward goals     Plan      Co-evaluation  End of Session     Activity Tolerance Patient tolerated treatment well   Patient Left in chair;with call bell/phone within reach   Nurse Communication          Time: [id number] OT Time Calculation (min): 20 min  Charges: OT General Charges $OT Visit: 1 Procedure OT Treatments $Self Care/Home Management : 8-22 mins  Anna Stanton 05/21/2015, 10:18 AM   Anna Stanton, OTR/L [phone] 05/21/2015

## 2015-05-21 NOTE — Progress Notes (Signed)
Physical Therapy Treatment Patient Details Name: Anna Stanton MRN: [id number] DOB: Jun 01, 1956 Today's Date: 05/21/2015    History of Present Illness R THR    PT Comments    Pt. Appears very anxious throughout treatment; required min assist for STS and min guard for ambulation; ambulated ~30' with definite antalgic gait pattern, decreased stance time and weight shift on R and decreased step length on L; scrunches/shuffles toes to advance RLE; 25% VC required to keep patient from looking down; performed stair training with 75% VC for correct technique and step-by-step instruction along with mod physical assist for navigation of crutch and RLE; pt. presented anxious/apprehensive of the stairs however with the VC and verbal support pt. able to complete with increased time (will need to review with husband before DC today) - RN notified    Follow Up Recommendations  Home health PT     Equipment Recommendations  Rolling walker with 5" wheels    Recommendations for Other Services OT consult     Precautions / Restrictions Precautions Precautions: Fall Restrictions Weight Bearing Restrictions: No Other Position/Activity Restrictions: WBAT    Mobility  Bed Mobility               General bed mobility comments: OOB in recliner   Transfers Overall transfer level: Needs assistance Equipment used: Rolling walker (2 wheeled) Transfers: Sit to/from Stand Sit to Stand: Min assist         General transfer comment: steadying assistance and cues for UE/LE placement  Ambulation/Gait Ambulation/Gait assistance: Min guard Ambulation Distance (Feet): 40 Feet Assistive device: Rolling walker (2 wheeled) Gait Pattern/deviations: Step-to pattern;Decreased step length - left;Decreased stance time - right;Decreased weight shift to right;Antalgic;Trunk flexed;Shuffle Gait velocity: decreased   General Gait Details: step-to antalgic gait pattern with decreased stance time/weight shift  to RLE and decreased step length on L; shuffles/scrunches toes to advance RLE; VC to look upright   Stairs Stairs: Yes Stairs assistance: Mod assist Stair Management: One rail Left;Forwards;With crutches Number of Stairs: 4 General stair comments: pt. required 75% VC for correct technique and step-by-step instruction along with mod assist for navigation of crutch and RLE; pt. presented anxious/apprehensive and scared of the stairs however with the VC and verbal support pt. able to complete with increased time  Wheelchair Mobility    Modified Rankin (Stroke Patients Only)       Balance                                    Cognition Arousal/Alertness: Awake/alert Behavior During Therapy: WFL for tasks assessed/performed;Anxious Overall Cognitive Status: Within Functional Limits for tasks assessed                      Exercises Total Joint Exercises Ankle Circles/Pumps: AROM;Both;15 reps;Supine Quad Sets: AROM;10 reps;Supine;Right;Seated Gluteal Sets: AROM;10 reps;Both;Supine;Seated    General Comments        Pertinent Vitals/Pain Pain Assessment: 0-10 Pain Score: 5  Pain Location: R thigh/hip  Pain Descriptors / Indicators: Sore;Cramping Pain Intervention(s): Limited activity within patient's tolerance;Monitored during session;Premedicated before session;Ice applied;Repositioned    Home Living                      Prior Function            PT Goals (current goals can now be found in the care plan section) Acute Rehab PT Goals Time  For Goal Achievement: 05/27/15 Potential to Achieve Goals: Good Progress towards PT goals: Progressing toward goals    Frequency  7X/week    PT Plan Current plan remains appropriate    Co-evaluation             End of Session Equipment Utilized During Treatment: Gait belt Activity Tolerance: Patient limited by pain Patient left: in chair;with call bell/phone within reach;with chair alarm  set     Time: [id number] PT Time Calculation (min) (ACUTE ONLY): 25 min  Charges:  $Gait Training: 8-22 mins $Therapeutic Activity: 8-22 mins                    G CodesDenna Haggard, SPTA   05/21/2015 1:20 PM   Pager: [phone]   Reviewed and agree with above Rica Koyanagi  PTA [facility]  Acute  Rehab Pager      [phone]

## 2015-05-21 NOTE — Progress Notes (Signed)
   Subjective: 2 Days Post-Op Procedure(s) (LRB): RIGHT TOTAL HIP ARTHROPLASTY ANTERIOR APPROACH (Right) Patient reports pain as mild.   Patient seen in rounds with Dr. Wynelle Link.  She got up with therapy yesterday. Her biggest concern this morning is the 7 or 8 steps that she has at home. Will work with therapy on stair training today and then home after therapy session. Patient is well, but has had some minor complaints of pain in the hip, requiring pain medications Patient is ready to go home later today after therapy.  Objective: Vital signs in last 24 hours: Temp:  [97.9 F (36.6 C)-99.1 F (37.3 C)] 99.1 F (37.3 C) (12/02 0500) Pulse Rate:  [72-78] 74 (12/02 0500) Resp:  [16-17] 17 (12/02 0500) BP: (110-132)/(49-65) 132/64 mmHg (12/02 0500) SpO2:  [93 %-100 %] 100 % (12/02 0500)  Intake/Output from previous day:  Intake/Output Summary (Last 24 hours) at 05/21/15 0800 Last data filed at 05/20/15 1926  Gross per 24 hour  Intake    460 ml  Output    900 ml  Net   -440 ml    Labs:  Recent Labs  05/20/15 0454 05/21/15 0420  HGB 10.5* 9.3*    Recent Labs  05/20/15 0454 05/21/15 0420  WBC 14.5* 16.0*  RBC 3.69* 3.26*  HCT 32.6* 28.8*  PLT 299 251    Recent Labs  05/20/15 0454 05/21/15 0420  NA 137 138  K 4.4 3.9  CL 104 105  CO2 28 27  BUN 17 12  CREATININE 0.77 0.53  GLUCOSE 162* 127*  CALCIUM 8.5* 8.5*   No results for input(s): LABPT, INR in the last 72 hours.  EXAM: General - Patient is Alert, Appropriate and Oriented Extremity - Neurovascular intact Sensation intact distally Dorsiflexion/Plantar flexion intact Incision - clean, dry, no drainage Motor Function - intact, moving foot and toes well on exam.   Assessment/Plan: 2 Days Post-Op Procedure(s) (LRB): RIGHT TOTAL HIP ARTHROPLASTY ANTERIOR APPROACH (Right) Procedure(s) (LRB): RIGHT TOTAL HIP ARTHROPLASTY ANTERIOR APPROACH (Right) Past Medical History  Diagnosis Date  . OA  (osteoarthritis)   . Hypertension   . Asthma   . H/O: rheumatic fever     AS CHILD  . Cancer ([place])     HX skin cancer removed   Principal Problem:   OA (osteoarthritis) of hip Active Problems:   Morbid obesity ([place])  Estimated body mass index is 46.25 kg/(m^2) as calculated from the following:   Height as of this encounter: 5\' 3"  (1.6 m).   Weight as of this encounter: 118.389 kg (261 lb). Up with therapy Discharge home with home health Diet - Cardiac diet Follow up - in 2 weeks on Tuesday 06/01/2015 Activity - WBAT Disposition - Home Condition Upon Discharge - improving. D/C Meds - See DC Summary DVT Prophylaxis - Xarelto  Arlee Muslim, PA-C Orthopaedic Surgery 05/21/2015, 8:00 AM

## 2015-10-27 ENCOUNTER — Other Ambulatory Visit: Payer: Self-pay | Admitting: Dermatology

## 2019-07-31 ENCOUNTER — Ambulatory Visit: Payer: BC Managed Care – PPO

## 2020-02-27 ENCOUNTER — Telehealth: Payer: Self-pay | Admitting: Hospice and Palliative Medicine

## 2020-02-27 ENCOUNTER — Encounter: Payer: Self-pay | Admitting: Hospice and Palliative Medicine

## 2020-02-27 NOTE — Telephone Encounter (Signed)
I called to discuss with patient about Covid-19 symptoms and the use of regeneron, a monoclonal antibody infusion for those with mild to moderate Covid-19 symptoms and at a high risk of hospitalization.     Pt is qualified for this infusion at the [facility] due to co-morbid conditions and/or a member of an at-risk group.     Unable to reach pt. Left message to return call  Altha Harm, PhD, NP-C [phone] ([place])

## 2020-02-28 ENCOUNTER — Telehealth: Payer: Self-pay | Admitting: Oncology

## 2020-02-28 ENCOUNTER — Other Ambulatory Visit: Payer: Self-pay | Admitting: Oncology

## 2020-02-28 ENCOUNTER — Encounter: Payer: Self-pay | Admitting: Oncology

## 2020-02-28 ENCOUNTER — Ambulatory Visit (HOSPITAL_COMMUNITY)
Admission: RE | Admit: 2020-02-28 | Discharge: 2020-02-28 | Disposition: A | Discharge status: Home / Self Care | Payer: BC Managed Care – PPO | Source: Ambulatory Visit | Attending: Pulmonary Disease | Admitting: Pulmonary Disease

## 2020-02-28 DIAGNOSIS — U071 COVID-19: Secondary | ICD-10-CM | POA: Insufficient documentation

## 2020-02-28 MED ORDER — METHYLPREDNISOLONE SODIUM SUCC 125 MG IJ SOLR: 125.0000 mg | Freq: Once | INTRAMUSCULAR | Status: DC | PRN

## 2020-02-28 MED ORDER — FAMOTIDINE IN NACL 20-0.9 MG/50ML-% IV SOLN: 20.0000 mg | Freq: Once | INTRAVENOUS | Status: DC | PRN

## 2020-02-28 MED ORDER — SODIUM CHLORIDE 0.9 % IV SOLN: INTRAVENOUS | Status: DC | PRN

## 2020-02-28 MED ORDER — SODIUM CHLORIDE 0.9 % IV SOLN
1200.0000 mg | Freq: Once | INTRAVENOUS | Status: AC
  Administered 2020-02-28: 1200 mg via INTRAVENOUS
  Filled 2020-02-28: qty 10

## 2020-02-28 MED ORDER — EPINEPHRINE 0.3 MG/0.3ML IJ SOAJ: 0.3000 mg | Freq: Once | INTRAMUSCULAR | Status: DC | PRN

## 2020-02-28 MED ORDER — DIPHENHYDRAMINE HCL 50 MG/ML IJ SOLN: 50.0000 mg | Freq: Once | INTRAMUSCULAR | Status: DC | PRN

## 2020-02-28 MED ORDER — ALBUTEROL SULFATE HFA 108 (90 BASE) MCG/ACT IN AERS: 2.0000 | INHALATION_SPRAY | Freq: Once | RESPIRATORY_TRACT | Status: DC | PRN

## 2020-02-28 NOTE — Discharge Instructions (Signed)

## 2020-02-28 NOTE — Telephone Encounter (Signed)
I connected by phone with  Mrs. Jablonski to discuss the potential use of an new treatment for mild to moderate COVID-19 viral infection in non-hospitalized patients.   This patient is a age/sex that meets the FDA criteria for Emergency Use Authorization of casirivimab\imdevimab.  Has a (+) direct SARS-CoV-2 viral test result 1. Has mild or moderate COVID-19  2. Is ? 62 years of age and weighs ? 40 kg 3. Is NOT hospitalized due to COVID-19 4. Is NOT requiring oxygen therapy or requiring an increase in baseline oxygen flow rate due to COVID-19 5. Is within 10 days of symptom onset 6. Has at least one of the high risk factor(s) for progression to severe COVID-19 and/or hospitalization as defined in EUA. ? Specific high risk criteria : Past Medical History:  Diagnosis Date  . Asthma   . Cancer ([place])    HX skin cancer removed  . H/O: rheumatic fever    AS CHILD  . Hypertension   . OA (osteoarthritis)   ?  ?  Symptom onset  02/22/2020   I have spoken and communicated the following to the patient or parent/caregiver:   1. FDA has authorized the emergency use of casirivimab\imdevimab for the treatment of mild to moderate COVID-19 in adults and pediatric patients with positive results of direct SARS-CoV-2 viral testing who are 46 years of age and older weighing at least 40 kg, and who are at high risk for progressing to severe COVID-19 and/or hospitalization.   2. The significant known and potential risks and benefits of casirivimab\imdevimab, and the extent to which such potential risks and benefits are unknown.   3. Information on available alternative treatments and the risks and benefits of those alternatives, including clinical trials.   4. Patients treated with casirivimab\imdevimab should continue to self-isolate and use infection control measures (e.g., wear mask, isolate, social distance, avoid sharing personal items, clean and disinfect "high touch" surfaces, and frequent handwashing)  according to CDC guidelines.    5. The patient or parent/caregiver has the option to accept or refuse casirivimab\imdevimab .   After reviewing this information with the patient, The patient agreed to proceed with receiving casirivimab\imdevimab infusion and will be provided a copy of the Fact sheet prior to receiving the infusion.Rulon Abide, AGNP-C [phone] ([place])

## 2020-02-28 NOTE — Progress Notes (Signed)
  Diagnosis: COVID-19  Physician: Dr. Joya Gaskins  Procedure: Covid Infusion Clinic Med: casirivimab\imdevimab infusion - Provided patient with casirivimab\imdevimab fact sheet for patients, parents and caregivers prior to infusion.  Complications: No immediate complications noted.  Discharge: Discharged home   Tia Masker 02/28/2020

## 2022-06-18 ENCOUNTER — Ambulatory Visit: Admit: 2022-06-18 | Payer: BC Managed Care – PPO

## 2024-03-04 ENCOUNTER — Other Ambulatory Visit: Payer: Self-pay | Admitting: Medical Genetics

## 2024-03-24 ENCOUNTER — Other Ambulatory Visit: Payer: Self-pay | Admitting: Obstetrics and Gynecology

## 2024-03-24 DIAGNOSIS — Z1231 Encounter for screening mammogram for malignant neoplasm of breast: Secondary | ICD-10-CM

## 2024-04-09 ENCOUNTER — Ambulatory Visit
Admission: RE | Admit: 2024-04-09 | Discharge: 2024-04-09 | Disposition: A | Source: Ambulatory Visit | Attending: Obstetrics and Gynecology | Admitting: Obstetrics and Gynecology

## 2024-04-09 DIAGNOSIS — Z1231 Encounter for screening mammogram for malignant neoplasm of breast: Secondary | ICD-10-CM

## 2024-05-28 ENCOUNTER — Inpatient Hospital Stay (HOSPITAL_COMMUNITY)
Admission: RE | Admit: 2024-05-28 | Discharge: 2024-05-28 | Payer: Self-pay | Attending: Medical Genetics | Admitting: Medical Genetics

## 2024-06-08 LAB — GENECONNECT MOLECULAR SCREEN: Genetic Analysis Overall Interpretation: NEGATIVE
